# Patient Record
Sex: Female | Born: 1960 | Race: White | Hispanic: No | Marital: Married | Smoking: Never smoker
Health system: Southern US, Community
[De-identification: ages and names within clinical notes are randomized; demographics above are authoritative.]

## PROBLEM LIST (undated history)

## (undated) DIAGNOSIS — M810 Age-related osteoporosis without current pathological fracture: Secondary | ICD-10-CM

## (undated) DIAGNOSIS — R29898 Other symptoms and signs involving the musculoskeletal system: Secondary | ICD-10-CM

## (undated) DIAGNOSIS — A048 Other specified bacterial intestinal infections: Secondary | ICD-10-CM

## (undated) DIAGNOSIS — M6250 Muscle wasting and atrophy, not elsewhere classified, unspecified site: Secondary | ICD-10-CM

## (undated) DIAGNOSIS — K5190 Ulcerative colitis, unspecified, without complications: Secondary | ICD-10-CM

## (undated) HISTORY — DX: Other symptoms and signs involving the musculoskeletal system: R29.898

## (undated) HISTORY — DX: Muscle wasting and atrophy, not elsewhere classified, unspecified site: M62.50

## (undated) HISTORY — PX: BUNIONECTOMY: SHX129

## (undated) HISTORY — DX: Ulcerative colitis, unspecified, without complications: K51.90

## (undated) HISTORY — PX: UPPER GASTROINTESTINAL ENDOSCOPY: SHX188

## (undated) HISTORY — DX: Other specified bacterial intestinal infections: A04.8

## (undated) HISTORY — PX: POLYPECTOMY: SHX149

## (undated) HISTORY — DX: Age-related osteoporosis without current pathological fracture: M81.0

---

## 1997-10-05 ENCOUNTER — Ambulatory Visit (HOSPITAL_COMMUNITY): Admission: RE | Admit: 1997-10-05 | Discharge: 1997-10-05 | Payer: Self-pay | Admitting: Family Medicine

## 1998-12-23 ENCOUNTER — Other Ambulatory Visit: Admission: RE | Admit: 1998-12-23 | Discharge: 1998-12-23 | Payer: Self-pay | Admitting: Obstetrics & Gynecology

## 1999-11-28 ENCOUNTER — Encounter: Admission: RE | Admit: 1999-11-28 | Discharge: 1999-11-28 | Payer: Self-pay | Admitting: Sports Medicine

## 2000-03-19 ENCOUNTER — Encounter (INDEPENDENT_AMBULATORY_CARE_PROVIDER_SITE_OTHER): Payer: Self-pay

## 2000-03-19 ENCOUNTER — Other Ambulatory Visit: Admission: RE | Admit: 2000-03-19 | Discharge: 2000-03-19 | Payer: Self-pay | Admitting: Obstetrics & Gynecology

## 2002-06-27 ENCOUNTER — Other Ambulatory Visit: Admission: RE | Admit: 2002-06-27 | Discharge: 2002-06-27 | Payer: Self-pay | Admitting: Obstetrics & Gynecology

## 2003-09-26 ENCOUNTER — Other Ambulatory Visit: Admission: RE | Admit: 2003-09-26 | Discharge: 2003-09-26 | Payer: Self-pay | Admitting: Obstetrics & Gynecology

## 2005-04-20 ENCOUNTER — Other Ambulatory Visit: Admission: RE | Admit: 2005-04-20 | Discharge: 2005-04-20 | Payer: Self-pay | Admitting: Obstetrics & Gynecology

## 2007-06-16 HISTORY — PX: COLONOSCOPY: SHX174

## 2007-11-11 ENCOUNTER — Ambulatory Visit: Payer: Self-pay | Admitting: Internal Medicine

## 2007-12-22 ENCOUNTER — Ambulatory Visit: Payer: Self-pay | Admitting: Internal Medicine

## 2007-12-22 ENCOUNTER — Encounter: Payer: Self-pay | Admitting: Internal Medicine

## 2007-12-24 ENCOUNTER — Encounter: Payer: Self-pay | Admitting: Internal Medicine

## 2010-03-13 ENCOUNTER — Ambulatory Visit: Payer: Self-pay | Admitting: Sports Medicine

## 2010-03-13 DIAGNOSIS — M722 Plantar fascial fibromatosis: Secondary | ICD-10-CM | POA: Insufficient documentation

## 2010-03-13 DIAGNOSIS — M84376A Stress fracture, unspecified foot, initial encounter for fracture: Secondary | ICD-10-CM | POA: Insufficient documentation

## 2010-03-31 ENCOUNTER — Ambulatory Visit: Payer: Self-pay | Admitting: Sports Medicine

## 2010-03-31 DIAGNOSIS — M2170 Unequal limb length (acquired), unspecified site: Secondary | ICD-10-CM | POA: Insufficient documentation

## 2010-04-23 ENCOUNTER — Ambulatory Visit: Payer: Self-pay | Admitting: Sports Medicine

## 2010-04-23 DIAGNOSIS — M21619 Bunion of unspecified foot: Secondary | ICD-10-CM | POA: Insufficient documentation

## 2010-04-23 DIAGNOSIS — R269 Unspecified abnormalities of gait and mobility: Secondary | ICD-10-CM | POA: Insufficient documentation

## 2010-05-21 ENCOUNTER — Ambulatory Visit: Payer: Self-pay | Admitting: Sports Medicine

## 2010-07-17 NOTE — Assessment & Plan Note (Signed)
Summary: F/U,MC   Vital Signs:  Patient profile:   50 year old female BP sitting:   108 / 71  Vitals Entered By: Lillia Pauls CMA (March 31, 2010 11:10 AM)  History of Present Illness: Follow up navicular accesory stress fracture.  Has been wearing boot, using crutches, and using the exercise bike as instructed.  No pain with those activities.  Some pain when pressing on the navicular.  Swelling improved.  Well otherwise.   Current Problems (verified): 1)  Plantar Fasciitis, Bilateral  (ICD-728.71) 2)  Stress Fracture, Foot  (ICD-733.94)  Current Medications (verified): 1)  None  Allergies (verified): No Known Drug Allergies  Review of Systems  The patient denies anorexia, fever, weight loss, chest pain, syncope, and abdominal pain.    Physical Exam  General:  VS noted.  Well NAD Msk:  Legs: 1.5 cm leg legth discrepancy R>L Foot: Morton's feet bilaterally. Bunions bilaterally with L>>R. Bilateral collapse of longitudinal arch. No edema over medial aspect of navicular bone on R foot. Point tenderness over the same area.  No pain with ankle, foot, and toe ROM.   able to walk 10 steps now without limp or pain   Impression & Recommendations:  Problem # 1:  STRESS FRACTURE, FOOT (ICD-733.94) Assessment Improved Doing well.  Fx of accessory navicular bone. Plan to start weight bearing with walking boot.  Will follow up in 3.5 weeks  (6 weeks in boot). Pain as guide ice for pain. Pt voices understanding.   Problem # 2:  UNEQUAL LEG LENGTH (ICD-736.81) this needs correction to avoid futuire stress fxs  will make orthtoics on RTC to correct this  Patient Instructions: 1)  Thank you for seeing me today. 2)  Please come back in 3.5 weeks (6 weeks after being in the boot). 3)  Bring you running shoes and shorts with you. 4)  Please continue to wear your boot when walking.  5)  You may walk with the boot.    Orders Added: 1)  Est. Patient Level III [04540]

## 2010-07-17 NOTE — Assessment & Plan Note (Signed)
Summary: F/U,MC   Vital Signs:  Patient profile:   50 year old female BP sitting:   114 / 75  Vitals Entered By: Lillia Pauls CMA (May 21, 2010 9:38 AM)  History of Present Illness: pt here to fu left stress fx which she say feels good, but not 100%. pt went on a 3 mile run yesterday to test the foot and had no pain and even stated it felt better after the run. today she says it feels a little sore but not painful.  she had done 10 mins on TM OK had done xtraining and walking w no pain  however this run felt pretty long and she is sore in HS and calves now  Allergies: No Known Drug Allergies  Physical Exam  General:  Well-developed,well-nourished,in no acute distress; alert,appropriate and cooperative throughout examination Msk:  TTP over accesory navicular on left foot no swelling this time no percussion pain at main navicular  chronic changes in feet as before  today walks without any limp  running gait is sore and she is limping   Impression & Recommendations:  Problem # 1:  STRESS FRACTURE, FOOT (ICD-733.94) still not 100%  don't push running too quickly - see instructions  clinically though this appears to be healing  cont on this plan  if tenderness persistss past 1 month, I want to rescan  Problem # 2:  ABNORMALITY OF GAIT (ICD-781.2) orthotics controlling foot position well  use these for all running  no running with limp so use pain as guide  Patient Instructions: 1)  wait 2 to 3 days until you have less pain in foot 2)  then return to treadmill 3)  try 10 mins if OK you can add 2 mins per day of running until you reach 30 mins on Treadmill don't go to harder surfaces 4)  cross train as much as you wish 5)  keep up the 3 exercise and add steps pigeon toed - try 3 sets of 15 6)  don't increase any activity if you are seeing swelling or increasing pain levels   Orders Added: 1)  Est. Patient Level III [47829]

## 2010-07-17 NOTE — Assessment & Plan Note (Signed)
Summary: F/U STRESS FRACTURE,MC   Vital Signs:  Patient profile:   50 year old female BP sitting:   111 / 73  Vitals Entered By: Lillia Pauls CMA (April 23, 2010 9:21 AM)  CC:  stress fracture, plantar fasciitis, and gait abN.  History of Present Illness: Follow up navicular accesory stress fracture: Has been wearing boot and using the exercise bike and walking.  No pain with those activities but bored with biking.  Non-tender when tapping on navicular bone. Swelling improved.   Plantar fasciitis: Improved, continues to wear arch straps bilaterally but says she is not having any pain.   Leg length discrepency with gait abnormality: Pt has a leg length difference of 1.5 cm. She has trendelenburg walk and is in need of orthotics to correct her gait.   Bunion: Pt has bilatearl bunions with the Rt being much worse than the left. The Rt foot has a Morton's toe.   Allergies: No Known Drug Allergies  Physical Exam  General:  Well-developed,well-nourished,in no acute distress; alert,appropriate and cooperative throughout examination Msk:  bunions bilaterally Rt>>Lt, mortons toes on Right, left foot has accessory navicular with medial protrusion but nontender to palpation, leg length is different (Rt 1.5 cm longer than left) Pt has trendelenburg gait while walking.  hop test illicits pain on left and pt is only albe to do 1 hop,  5 hops on left without difficulty Additional Exam:  MSK [place] navicular area no longer has inc doppler flow small avulsion noted on accesory navicular left while acc nav is irregular - less sings of swelling or inc flow no edema PF on left is still 0.77 with some calcification at proximal medial insertion to calcaneus PF on RT is about 0.45     Impression & Recommendations:  Problem # 1:  STRESS FRACTURE, FOOT (ICD-733.94) Assessment Improved  Pt may wean from  boot to shoe w orthotics over next 2 wks  Patient was fitted for a standard,  cushioned, semi-rigid orthotic.  The orthotic was heated and the patient stood on the orthotic blank positioned on the orthotic stand. The patient was positioned in subtalar neutral position and 10 degrees of ankle dorsiflexion in a weight bearing stance. After completion of molding a stable based was applied to the orthotic blank.   The blank was ground to a stable position for weight bearing. size 7 blue swirl med density EVA base     reck after 4 weeks progresss bike - elliptical - to treadmill - no running more than 1 min interval until reck posting first ray post on RT additional orthotic padding  lift on left  [place] scan suggests healing  Orders: [employer], each unit (L3002)  Problem # 2:  PLANTAR FASCIITIS, BILATERAL (ICD-728.71) Assessment: Improved  Pt is not having any pain with the plantar fasciitis anymore. Cont to wear the straps for comfort as needed  still very thick on [place]  will start PF strech and exer series  Orders: [place] LIMITED ([id number]) [employer], each unit (Y7829)  Problem # 3:  ABNORMALITY OF GAIT (ICD-781.2) Assessment: Unchanged  Pt has gait abnormality on exam. This should be corrected with some custom orthotics.    after placemnt of orthotics patient has control of pronation there is trendelenburg to left w orthtoics but this is almost 100% corrected w them  Orders: [place] LIMITED ([id number]) [employer], each unit (Y8657)  Problem # 4:  BUNION, RIGHT FOOT (ICD-727.1) will follow   Orders Added: 1)  Est. Patient Level IV [14782] 2)  [place] LIMITED [[id number]] 3)  Or[employer], each unit [L3002]

## 2010-07-17 NOTE — Assessment & Plan Note (Signed)
Summary: PLANTAR FASCIITIS,MC   Vital Signs:  Patient profile:   50 year old female Height:      67 inches Weight:      147 pounds BMI:     23.11 BP sitting:   119 / 77  Vitals Entered By: Lillia Pauls CMA (March 13, 2010 11:19 AM)  History of Present Illness: Patient is a 50 yo F with a 3 month history of plantar fasciitis who comes in with continued discomfort and new swelling on medial aspect of foot. Patient tried using store-bought insoles and an arch strap without full relief. She has had to decrease her running. She works for [employer] and has to be on her feet most of the day, frequently wearing heals. About a month ago, she developed the swelling along the medial aspect of her foot. It was initially bruised. Swelling and bruising now improved, but she still has pain and is limited in her activity.  Allergies: No Known Drug Allergies  Physical Exam  General:  Well-developed,well-nourished,in no acute distress; alert,appropriate and cooperative throughout examination Msk:  Legs: 1.5 cm leg legth discrepancy R>L Foot: Morton's feet bilaterally. Bunions bilaterally with L>>R. Bilateral collapse of longitudinal arch. Edema over medial aspect of navicular bone on R foot. Point tenderness over the same area. Point tenderness on plantar surface of foot at insertion of plantar fascia to calcaneus.  Ultrasound: Accessory navicular bone on R with fluid and irregularity consistent with fracture. Increased doppler flow to accessory bone. Normal navicular bone without evidence of edema, no irregularity. Ant. tibialis tendon intact without edema, irregularity.  Plantar fascia increased thickness R>L, but evidence of thickening bilaterally.   Impression & Recommendations:  Problem # 1:  STRESS FRACTURE, FOOT (ICD-733.94)  Ultrasound and exam consistent with stress fracture of accessory navicular bone of R foot. Patient has leg length discrepancy, bunion on L, plantar fasciitis that  may have led to changes in gait predisposing her to injury. Patient fitted with cam boot. She will use the cam boot and crutches for 2 weeks. She will be allowed to bike without standing on the pedals. She will also try an arch strap. We will reassess in 2 weeks to evaluate modification of activity.  Patient also instructed on Calcium, Vitamin D and Vitamin C regimen. 1500 mg Ca daily 800 international units Vit D daily 500 mg Vit C daily  Orders: [facility] Walker [phone]) Foot Orthosis ( Arch Strap/Heel Cup) [phone]) Crutches-[facility] [phone])  reck in 2 to 3 weeks and if improved may allow some touch down weight bearing  Problem # 2:  PLANTAR FASCIITIS, BILATERAL (ICD-728.71) Will rehabilitate for stress fracture first. Then evaluate for custom orthotics to address leg length discrepancy and plantar fasciitis.  needs orthtoics over long term  bunions and morton's foot changes are part of general foot breakdown  Patient Instructions: 1)  1500 mg Ca daily 2)  800 international units Vitamin D daily 3)  500mg  Vitamin C daily 4)  Ice daily 5)  OK to do the bike. 6)  Crutches with no weight bearing for 2 weeks. Cast boot. Recheck in 2 weeks.

## 2013-10-18 ENCOUNTER — Other Ambulatory Visit: Payer: Self-pay | Admitting: Obstetrics & Gynecology

## 2013-10-18 DIAGNOSIS — R928 Other abnormal and inconclusive findings on diagnostic imaging of breast: Secondary | ICD-10-CM

## 2013-11-08 ENCOUNTER — Encounter (INDEPENDENT_AMBULATORY_CARE_PROVIDER_SITE_OTHER): Payer: Self-pay

## 2013-11-08 ENCOUNTER — Ambulatory Visit
Admission: RE | Admit: 2013-11-08 | Discharge: 2013-11-08 | Disposition: A | Discharge status: Home / Self Care | Payer: BC Managed Care – PPO | Source: Ambulatory Visit | Attending: Obstetrics & Gynecology | Admitting: Obstetrics & Gynecology

## 2013-11-08 DIAGNOSIS — R928 Other abnormal and inconclusive findings on diagnostic imaging of breast: Secondary | ICD-10-CM

## 2013-12-07 ENCOUNTER — Telehealth: Payer: Self-pay | Admitting: *Deleted

## 2013-12-08 ENCOUNTER — Encounter: Payer: Self-pay | Admitting: Podiatrist

## 2013-12-08 ENCOUNTER — Ambulatory Visit (INDEPENDENT_AMBULATORY_CARE_PROVIDER_SITE_OTHER): Payer: BC Managed Care – PPO | Admitting: Podiatrist

## 2013-12-08 ENCOUNTER — Ambulatory Visit: Payer: Self-pay | Admitting: Podiatrist

## 2013-12-08 ENCOUNTER — Ambulatory Visit: Payer: BC Managed Care – PPO

## 2013-12-08 VITALS — BP 109/67 | HR 68 | Resp 15 | Ht 67.5 in | Wt 145.0 lb

## 2013-12-08 DIAGNOSIS — M79673 Pain in unspecified foot: Secondary | ICD-10-CM

## 2013-12-08 DIAGNOSIS — M79609 Pain in unspecified limb: Secondary | ICD-10-CM

## 2013-12-08 NOTE — Patient Instructions (Signed)
Bunionectomy A bunionectomy is surgery to remove a bunion. A bunion is an enlargement of the joint at the base of the big toe. It is made up of bone and soft tissue on the inside part of the joint. Over time, a painful lump appears on the inside of the joint. The big toe begins to point inward toward the second toe. New bone growth can occur and a bone spur may form. The pain eventually causes difficulty walking. A bunion usually results from inflammation caused by the irritation of poorly fitting shoes. It often begins later in life. A bunionectomy is performed when nonsurgical treatment no longer works. When surgery is needed, the extent of the procedure will depend on the degree of deformity of the foot. Your surgeon will discuss with you the different procedures and what will work best for you depending on your age and health. LET YOUR CAREGIVER KNOW ABOUT:   Previous problems with anesthetics or medicines used to numb the skin.  Allergies to dyes, iodine, foods, and/or latex.  Medicines taken including herbs, eye drops, prescription medicines (especially medicines used to "thin the blood"), aspirin and other over-the-counter medicines, and steroids (by mouth or as a cream).  History of bleeding or blood problems.  Possibility of pregnancy, if this applies.  History of blood clots in your legs and/or lungs .  Previous surgery.  Other important health problems. RISKS AND COMPLICATIONS   Infection.  Pain.  Nerve damage.  Possibility that the bunion will recur. BEFORE THE PROCEDURE  You should be present 60 minutes prior to your procedure or as directed.  PROCEDURE  Surgery is often done so that you can go home the same day (outpatient). It may be done in a hospital or in an outpatient surgical center. An anesthetic will be used to help you sleep during the procedure. Sometimes, a spinal anesthetic is used to make you numb below the waist. A cut (incision) is made over the swollen  area at the first joint of the big toe. The enlarged lump will be removed. If there is a need to reposition the bones of the big toe, this may require more than 1 incision. The bone itself may need to be cut. Screws and wires may be used in the repair. These can be removed at a later date. In severe cases, the entire joint may need to be removed and a joint replacement inserted. When done, the incision is closed with stitches (sutures). Skin adhesive strips may be added for reinforcement. They help hold the incision closed.  AFTER THE PROCEDURE  Compression bandages (dressings) are then wrapped around the wound. This helps to keep the foot in alignment and reduce swelling. Your foot will be monitored for bleeding and swelling. You will need to stay for a few hours in the recovery area before being discharged. This allows time for the anesthesia to wear off. You will be discharged home when you are awake, stable, and doing well. HOME CARE INSTRUCTIONS   You can expect to return to normal activities within 6 to 8 weeks after surgery. The foot is at increased risk for swelling for several months. When you can expect to bear weight on the operated foot will depend on the extent of your surgery. The milder the deformity, the less tissue is removed and the sooner the return to normal activity level. During the recovery period, a special shoe, boot, or cast may be worn to accommodate the surgical bandage and to help provide stability   to the foot.  Once you are home, an ice pack applied to the operative site may help with discomfort and keep swelling down. Stop using the ice if it causes discomfort.  Keep your feet raised (elevated) when possible to lessen swelling.  If you have an elastic bandage on your foot and you have numbness, tingling, or your foot becomes cold and blue, adjust the bandage to make it comfortable.  Change dressings as directed.  Keep the wound dry and clean. The wound may be washed  gently with soap and water. Gently blot dry without rubbing. Do not take baths or use swimming pools or hot tubs for 10 days, or as instructed by your caregiver.  Only take over-the-counter or prescription medicines for pain, discomfort, or fever as directed by your caregiver.  You may continue a normal diet as directed.  For activity, use crutches with no weight bearing or your orthopedic shoe as directed. Continue to use crutches or a cane as directed until you can stand without causing pain. SEEK MEDICAL CARE IF:   You have redness, swelling, bruising, or increasing pain in the wound.  There is pus coming from the wound.  You have drainage from a wound lasting longer than 1 day.  You have an oral temperature above 102 F (38.9 C).  You notice a bad smell coming from the wound or dressing.  The wound breaks open after sutures have been removed.  You develop dizzy episodes or fainting while standing.  You have persistent nausea or vomiting.  Your toes become cold.  Pain is not relieved with medicines. SEEK IMMEDIATE MEDICAL CARE IF:   You develop a rash.  You have difficulty breathing.  You develop any reaction or side effects to medicines given.  Your toes are numb or blue, or you have severe pain. MAKE SURE YOU:   Understand these instructions.  Will watch your condition.  Will get help right away if you are not doing well or get worse. Document Released: 05/15/2005 Document Revised: 08/24/2011 Document Reviewed: 06/20/2007 Exit[employer] Patient Information 2015 [employer]. This information is not intended to replace advice given to you by your health care provider. Make sure you discuss any questions you have with your health care provider.  

## 2013-12-08 NOTE — Progress Notes (Signed)
   Subjective:    Patient ID: Rachael Walker, female    DOB: [birth year], 53 y.o.   MRN: [id number]  HPI Comments: Pt states she has pain on and off with the Right foot due to the bunion and 2nd hammer toe, for over 20 years. Pt states she really has trouble running on hills.  Pt states the right 1st medial toe has a painful callous.  Pt states the thickened, discolored toenails are painful when running - right 1,2, 4,5th and left 1,4,5 th toenails.     Review of Systems  All other systems reviewed and are negative.      Objective:   Physical Exam Patient is awake, alert, and oriented x 3.  In no acute distress.  Vascular status is intact with palpable pedal pulses at 2/4 DP and PT bilateral and capillary refill time within normal limits. Neurological sensation is also intact bilaterally via Semmes Weinstein monofilament at 5/5 sites. Light touch, vibratory sensation, Achilles tendon reflex is intact. Dermatological exam reveals dystropic toenails from running otherwise skin color, turger and texture as normal. No open lesions present.  Musculature intact with dorsiflexion, plantarflexion, inversion, eversion. Severe bunion deformity is present on the right foot.  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  Large medial eminence from the bunion is present .  Large callus is present on the hallux and first metatarsal head right foot.      Assessment & Plan:  Bunion and hammertoe right foot  Plan:  The patient has tried changing shoes and reducing her running to walking and is still experiencing pain.  We discussed orthotics but ultimately discussed that surgical intervention would be necessary to correct the deformity.  She would like to consider the surgery in the cooler months as she is very active at this time.  She will call 1-2 months prior to the anticipated surgery date for a pre operative consult.  I did discuss that she will need to be in a large boot for 2-4  weeks followed by a post op shoe. No running for at least 3 months post operatively.  She demonstrates an understanding of this conversation and will be seen back at her convenience for pre operative consult.

## 2014-01-11 ENCOUNTER — Ambulatory Visit (INDEPENDENT_AMBULATORY_CARE_PROVIDER_SITE_OTHER): Payer: BC Managed Care – PPO

## 2014-01-11 ENCOUNTER — Other Ambulatory Visit: Payer: Self-pay

## 2014-01-11 DIAGNOSIS — R52 Pain, unspecified: Secondary | ICD-10-CM

## 2014-02-01 NOTE — Telephone Encounter (Signed)
Prescription sent to [facility] for a Musculoskeletal Pain compounding cream. Baclofen 2%, Cyclobenzaprine 2%, Diclofenac 3% and Lidocaine 2% 180 gm tube, 2 refills, Diagnosis: [facility] [address] [place], [place] [address] Ph: [phone]

## 2014-03-19 ENCOUNTER — Encounter: Payer: Self-pay | Admitting: Family Medicine

## 2014-03-19 ENCOUNTER — Ambulatory Visit (INDEPENDENT_AMBULATORY_CARE_PROVIDER_SITE_OTHER): Payer: BC Managed Care – PPO | Admitting: Family Medicine

## 2014-03-19 VITALS — BP 104/68 | HR 63 | Temp 98.3°F | Resp 16 | Ht 68.0 in | Wt 146.6 lb

## 2014-03-19 DIAGNOSIS — R312 Other microscopic hematuria: Secondary | ICD-10-CM

## 2014-03-19 DIAGNOSIS — Z23 Encounter for immunization: Secondary | ICD-10-CM

## 2014-03-19 DIAGNOSIS — B3731 Acute candidiasis of vulva and vagina: Secondary | ICD-10-CM

## 2014-03-19 DIAGNOSIS — B373 Candidiasis of vulva and vagina: Secondary | ICD-10-CM

## 2014-03-19 DIAGNOSIS — R300 Dysuria: Secondary | ICD-10-CM

## 2014-03-19 DIAGNOSIS — R3129 Other microscopic hematuria: Secondary | ICD-10-CM

## 2014-03-19 LAB — POCT WET PREP WITH KOH
KOH Prep POC: POSITIVE
Trichomonas, UA: NEGATIVE
Yeast Wet Prep HPF POC: NEGATIVE

## 2014-03-19 LAB — POCT UA - MICROSCOPIC ONLY
Casts, Ur, LPF, POC: NEGATIVE
Crystals, Ur, HPF, POC: NEGATIVE
Mucus, UA: NEGATIVE
Yeast, UA: NEGATIVE

## 2014-03-19 LAB — POCT URINALYSIS DIPSTICK
BILIRUBIN UA: NEGATIVE
GLUCOSE UA: NEGATIVE
KETONES UA: NEGATIVE
NITRITE UA: NEGATIVE
PH UA: 5.5
Protein, UA: NEGATIVE
Spec Grav, UA: 1.015
Urobilinogen, UA: 0.2

## 2014-03-19 MED ORDER — FLUCONAZOLE 150 MG PO TABS: 150.0000 mg | ORAL_TABLET | Freq: Once | ORAL | Status: DC

## 2014-03-19 NOTE — Patient Instructions (Signed)
I will be in touch with your urine culture asap. Let me know if your back pain returns.   For the time being use the diflucan for likely vaginal yeast infection- take 1 pill and repeat in a week if needed

## 2014-03-19 NOTE — Progress Notes (Addendum)
[facility] [address] [place] [address] [phone]  Date:  03/19/2014   Name:  Rachael Walker   DOB:  [birth year]   MRN:  [id number]  PCP:  Maisie Fus, MD    Chief Complaint: pt here to establish care and Back Pain   History of Present Illness:  Rachael Walker is a 53 y.o. very pleasant female patient who presents with the following:  Here today as a new patient to establish care.  She sees Dr. Nori Riis for her OB-GYN care, but wanted to get established with a family doctor as well as she is getting a bit older.  She has noted some aches in her upper back for about 6 week, more at night.  It felt "kind of like a cramp", could wake her up at night.  Sometimes the sx have been worse than others.  When she gets up in the am she will feel better.  She also thought that she might have a UTI and called her OBG.  Her urine was not tested but they called in macrobid for her about 3 weeks ago.  However it turns out she took it qd for 14 days instead of BID for 7 days so she is not sure if this was effective.  She still notes that she still has frequent urination- no burning, no hematuria.  She can have lower back ache as well.  She has also noted some vaginal burning for about 5 weeks.  No unusual discharge.  She also has noted some itchy palms and feet- this is now better.  Also she has noted blurred vision for about 5 weeks.  She does wear glasses but is due for a recheck   Last week she went out of town and was having worse back pain, she thinks due to strange bed.  However now her pain has really subsided  She enjoys running for exercise.   Her last pap was over the summer with Dr. Nori Riis.   She also recently had complete labs through her job and reports that her blood sugar was normal.   Patient Active Problem List   Diagnosis Date Noted  . BUNION, RIGHT FOOT 04/23/2010  . ABNORMALITY OF GAIT 04/23/2010  . UNEQUAL LEG LENGTH 03/31/2010  . PLANTAR FASCIITIS,  BILATERAL 03/13/2010  . STRESS FRACTURE, FOOT 03/13/2010    History reviewed. No pertinent past medical history.  History reviewed. No pertinent past surgical history.  History  Substance Use Topics  . Smoking status: Never Smoker   . Smokeless tobacco: Not on file  . Alcohol Use: Yes     Comment: 2    Family History  Problem Relation Age of Onset  . Osteoporosis Mother   . Cancer Father   . Diabetes Father   . Hyperlipidemia Father   . Cancer Maternal Grandmother     kidney  . Heart disease Maternal Grandfather   . Diabetes Paternal Grandmother   . Diabetes Brother     No Known Allergies  Medication list has been reviewed and updated.  No current outpatient prescriptions on file prior to visit.   No current facility-administered medications on file prior to visit.    Review of Systems:  As per HPI- otherwise negative.   Physical Examination: Filed Vitals:   03/19/14 1100  BP: 104/68  Pulse: 63  Temp: 98.3 F (36.8 C)  Resp: 16   Filed Vitals:   03/19/14 1100  Height: 5\' 8"  (1.727  m)  Weight: 146 lb 9.6 oz (66.497 kg)   Body mass index is 22.3 kg/(m^2). Ideal Body Weight: Weight in (lb) to have BMI = 25: 164.1  GEN: WDWN, NAD, Non-toxic, A & O x 3, looks well, appears fit and younger than age 58: Atraumatic, Normocephalic. Neck supple. No masses, No LAD. Ears and Nose: No external deformity. CV: RRR, No M/G/R. No JVD. No thrill. No extra heart sounds. PULM: CTA B, no wheezes, crackles, rhonchi. No retractions. No resp. distress. No accessory muscle use. ABD: S, NT, ND EXTR: No c/c/e NEURO Normal gait.  PSYCH: Normally interactive. Conversant. Not depressed or anxious appearing.  Calm demeanor.  Pelvic: normal, no vaginal lesions or discharge. Uterus normal, no CMT, no adnexal tendereness or masses At this time cannot reproduce her back pain by pressing on her back or manipulating her arms  Results for orders placed in visit on 03/19/14  POCT  UA - MICROSCOPIC ONLY      Result Value Ref Range   WBC, Ur, HPF, POC 10-13     RBC, urine, microscopic 4-6     Bacteria, U Microscopic trace     Mucus, UA neg     Epithelial cells, urine per micros tntc     Crystals, Ur, HPF, POC neg     Casts, Ur, LPF, POC neg     Yeast, UA neg    POCT URINALYSIS DIPSTICK      Result Value Ref Range   Color, UA yellow     Clarity, UA turbid     Glucose, UA neg     Bilirubin, UA neg     Ketones, UA neg     Spec Grav, UA 1.015     Blood, UA trace     pH, UA 5.5     Protein, UA neg     Urobilinogen, UA 0.2     Nitrite, UA neg     Leukocytes, UA small (1+)    POCT WET PREP WITH KOH      Result Value Ref Range   Trichomonas, UA Negative     Clue Cells Wet Prep HPF POC 6-19     Epithelial Wet Prep HPF POC 6-19     Yeast Wet Prep HPF POC neg     Bacteria Wet Prep HPF POC 3+     RBC Wet Prep HPF POC 2-5     WBC Wet Prep HPF POC 2-4     KOH Prep POC Positive       Assessment and Plan: Dysuria - Plan: POCT UA - Microscopic Only, POCT urinalysis dipstick, POCT Wet Prep with KOH, Urine culture  Monilial vaginitis - Plan: fluconazole (DIFLUCAN) 150 MG tablet  Recommended that we do x-rays of her back but she declines for now as her pain really seems better.  Unsure if she might have had a UTI recently as her urine was not tested.  She did take abx but did not take them correctly.  Her UA and micro are borderline.   I will give her a call with her urine culture- for the time being will just use diflucan for yeast vaginitis.  Defer another abx until culture comes back   Signed Lamar Blinks, MD Called 10/7: her culture is negative.  I will refer her to a urologist as she did have microhematuria.

## 2014-03-20 LAB — URINE CULTURE: Colony Count: 15000

## 2014-03-21 ENCOUNTER — Encounter: Payer: Self-pay | Admitting: Family Medicine

## 2014-03-21 NOTE — Addendum Note (Signed)
Addended by: Lamar Blinks C on: 03/21/2014 05:17 PM   Modules accepted: Orders

## 2014-06-15 ENCOUNTER — Ambulatory Visit (INDEPENDENT_AMBULATORY_CARE_PROVIDER_SITE_OTHER): Payer: BLUE CROSS/BLUE SHIELD | Admitting: Physician Assistant

## 2014-06-15 VITALS — BP 130/80 | HR 73 | Temp 97.8°F | Resp 16 | Ht 67.5 in | Wt 145.0 lb

## 2014-06-15 DIAGNOSIS — L0291 Cutaneous abscess, unspecified: Secondary | ICD-10-CM

## 2014-06-15 DIAGNOSIS — L03312 Cellulitis of back [any part except buttock]: Secondary | ICD-10-CM

## 2014-06-15 DIAGNOSIS — Z23 Encounter for immunization: Secondary | ICD-10-CM

## 2014-06-15 DIAGNOSIS — L0390 Cellulitis, unspecified: Secondary | ICD-10-CM

## 2014-06-15 MED ORDER — DOXYCYCLINE HYCLATE 100 MG PO CAPS: 100.0000 mg | ORAL_CAPSULE | Freq: Two times a day (BID) | ORAL | Status: AC

## 2014-06-15 NOTE — Progress Notes (Signed)
    MRN: [id number] DOB: 1960-07-10  Subjective:   Rachael Walker is a 54 y.o. female with a recent history of foot surgery (4 weeks ago), but otherwise healthy, is  presenting for concern of an unhealed wound at the center of her lower back following a fall 3 weeks ago.  She was in her bathroom, where she mistepped, and fell backwards, hitting her back on an air vent.   She notes that a nail was sticking out, but she was unsure if she had fallen onto that.  Since the fall, the wound has appeared to be healing, but has regressed.  She notes pain and swelling to her lower back.  She states that she was able to appreciate pus from the site.  She has tried neosporin, which has helped little.  She sleeps on her back and takes baths, and notes that the wound rubs against the grounding surface.  She denies fever, n/v, or numbness and tingling to lower extremity.  She is unsure of when her last tdap was updated.    Rachael Walker has a current medication list which includes the following prescription(s): drospirenone-ethinyl estradiol.  She has No Known Allergies.  Rachael Walker  has no past medical history on file. Also  has no past surgical history on file.  ROS As in subjective.  Objective:   Vitals: BP 130/80 mmHg  Pulse 73  Temp(Src) 97.8 F (36.6 C) (Oral)  Resp 16  Ht 5' 7.5" (1.715 m)  Wt 145 lb (65.772 kg)  BMI 22.36 kg/m2  SpO2 97%  Physical Exam  Constitutional: She is oriented to person, place, and time and well-developed, well-nourished, and in no distress. No distress.  Cardiovascular: Normal rate.   Pulmonary/Chest: Effort normal and breath sounds normal. No respiratory distress.  Neurological: She is alert and oriented to person, place, and time.  Skin:  Erythematous, indurated lesion with exudate.  No fluctuance appreciated.  Just lateral at right side, is small scabbing site with mild erythema.  No induration or fluctuance appreciated.    Psychiatric: Mood, memory, affect and  judgment normal.   Procedure: Verbal consent obtained.  2% Lidocaine used at site.  Povidine used for cleaning.  Incision placed at wound site with 11 blade.  Necrotic skin coat superficial dermis.  No purulent material produced.  Incision packed.  Dressings applied.     Assessment and Plan :  54 year old female is here for a wound at sacrum that has not healed in 3 weeks.  My suspicion is pressure has been applied to this area following injury and leg ambulation, that the wound has consistently had pressure and deroofing.  This must heal by secondary intention.  Placed on doxycycline 100mg  BID for 10 days Patient will RTC in 48 hours.  Advised to keep pressure off of the wound site.  Cellulitis of back except buttock - Plan: Wound culture, doxycycline (VIBRAMYCIN) 100 MG capsule Cellulitis and abscess - Plan: Wound culture  Need for Tdap vaccination - Plan: Tdap vaccine greater than or equal to 7yo IM  Rachael Drape, PA-C [facility] 1/1/20168:15 PM

## 2014-06-15 NOTE — Patient Instructions (Signed)
Incision and Drainage Incision and drainage is a procedure in which a sac-like structure (cystic structure) is opened and drained. The area to be drained usually contains material such as pus, fluid, or blood.  LET YOUR CAREGIVER KNOW ABOUT:   Allergies to medicine.  Medicines taken, including vitamins, herbs, eyedrops, over-the-counter medicines, and creams.  Use of steroids (by mouth or creams).  Previous problems with anesthetics or numbing medicines.  History of bleeding problems or blood clots.  Previous surgery.  Other health problems, including diabetes and kidney problems.  Possibility of pregnancy, if this applies. RISKS AND COMPLICATIONS  Pain.  Bleeding.  Scarring.  Infection. BEFORE THE PROCEDURE  You may need to have an ultrasound or other imaging tests to see how large or deep your cystic structure is. Blood tests may also be used to determine if you have an infection or how severe the infection is. You may need to have a tetanus shot. PROCEDURE  The affected area is cleaned with a cleaning fluid. The cyst area will then be numbed with a medicine (local anesthetic). A small incision will be made in the cystic structure. A syringe or catheter may be used to drain the contents of the cystic structure, or the contents may be squeezed out. The area will then be flushed with a cleansing solution. After cleansing the area, it is often gently packed with a gauze or another wound dressing. Once it is packed, it will be covered with gauze and tape or some other type of wound dressing. AFTER THE PROCEDURE   Often, you will be allowed to go home right after the procedure.  You may be given antibiotic medicine to prevent or heal an infection.  If the area was packed with gauze or some other wound dressing, you will likely need to come back in 1 to 2 days to get it removed.  The area should heal in about 14 days. Document Released: 11/25/2000 Document Revised: 12/01/2011  Document Reviewed: 07/27/2011 ExitCare Patient Information 2015 [employer]. This information is not intended to replace advice given to you by your health care provider. Make sure you discuss any questions you have with your health care provider.  

## 2014-06-17 ENCOUNTER — Ambulatory Visit (INDEPENDENT_AMBULATORY_CARE_PROVIDER_SITE_OTHER): Payer: BC Managed Care – PPO | Admitting: Physician Assistant

## 2014-06-17 VITALS — BP 118/78 | HR 82 | Temp 97.8°F | Resp 16

## 2014-06-17 DIAGNOSIS — L0390 Cellulitis, unspecified: Secondary | ICD-10-CM

## 2014-06-17 DIAGNOSIS — L0291 Cutaneous abscess, unspecified: Secondary | ICD-10-CM

## 2014-06-17 LAB — WOUND CULTURE
GRAM STAIN: NONE SEEN
Gram Stain: NONE SEEN

## 2014-06-17 NOTE — Progress Notes (Signed)
   Subjective:    Patient ID: Rachael Walker, female    DOB: 1960-09-15, 54 y.o.   MRN: [id number]  HPI Patient presents for wound check following I & D 2 days ago. Has been well in the interim. Denies additional production of pus, fever, or pain. Antibiotic well tolerated when taken with food. Did have one day of nausea when taken without breakfast. Has changed dressing twice. NKDA.   Review of Systems  Constitutional: Negative for fever and chills.  Skin: Positive for wound. Negative for color change and rash.       Objective:   Physical Exam  Constitutional: She is oriented to person, place, and time. She appears well-developed and well-nourished. No distress.  Blood pressure 118/78, pulse 82, temperature 97.8 F (36.6 C), resp. rate 16, last menstrual period 05/15/2014, SpO2 99 %.  HENT:  Head: Normocephalic and atraumatic.  Right Ear: External ear normal.  Left Ear: External ear normal.  Eyes: Right eye exhibits no discharge. Left eye exhibits no discharge.  Neurological: She is alert and oriented to person, place, and time.  Skin: Skin is warm and dry. No rash noted. She is not diaphoretic. No pallor.  Wound encircled minimally by erythema. Surface wound approx. same as depth of wound. Entire wound has necrotic tissue. Minimal pus expressed.    Procedure Consent obtained. Packing removed. Wound explored. Irrigated with 3cc 1% lido. Necrotic tissue removed. 1/2 in plain packing placed. Dressing placed. Care instructions given.      Assessment & Plan:  1. Cellulitis and abscess Wound healing ok. Antibiotic choice appropriate and should continue. RTC 06/20/14 for wound check.   Alveta Heimlich PA-C  [facility] 06/17/2014 9:12 AM

## 2014-06-20 ENCOUNTER — Ambulatory Visit (INDEPENDENT_AMBULATORY_CARE_PROVIDER_SITE_OTHER): Payer: BLUE CROSS/BLUE SHIELD | Admitting: Family Medicine

## 2014-06-20 VITALS — BP 106/68 | HR 78 | Temp 97.8°F | Resp 16

## 2014-06-20 DIAGNOSIS — Z5189 Encounter for other specified aftercare: Secondary | ICD-10-CM

## 2014-06-20 NOTE — Progress Notes (Signed)
[facility] [address], [place] [address] [phone]  Date:  06/20/2014   Name:  Rachael Walker   DOB:  03-25-1961   MRN:  [id number]  PCP:  Maisie Fus, MD    Chief Complaint: Wound Check   History of Present Illness:  Rachael Walker is a 54 y.o. very pleasant female patient who presents with the following:  Here today for a wound check- s/p I and D on 06/15/2014.   Last dressing change was on 1/3 She is doing quite well overall No pain or drainage No fever  Patient Active Problem List   Diagnosis Date Noted  . BUNION, RIGHT FOOT 04/23/2010  . ABNORMALITY OF GAIT 04/23/2010  . UNEQUAL LEG LENGTH 03/31/2010  . PLANTAR FASCIITIS, BILATERAL 03/13/2010  . STRESS FRACTURE, FOOT 03/13/2010    No past medical history on file.  Past Surgical History  Procedure Laterality Date  . Bunionectomy Right     History  Substance Use Topics  . Smoking status: Never Smoker   . Smokeless tobacco: Never Used  . Alcohol Use: Yes     Comment: 2    Family History  Problem Relation Age of Onset  . Osteoporosis Mother   . Cancer Father   . Diabetes Father   . Hyperlipidemia Father   . Cancer Maternal Grandmother     kidney  . Heart disease Maternal Grandfather   . Diabetes Paternal Grandmother   . Diabetes Brother     No Known Allergies  Medication list has been reviewed and updated.  Current Outpatient Prescriptions on File Prior to Visit  Medication Sig Dispense Refill  . doxycycline (VIBRAMYCIN) 100 MG capsule Take 1 capsule (100 mg total) by mouth 2 (two) times daily. 20 capsule 0  . Drospirenone-Ethinyl Estradiol (LORYNA PO) Take by mouth.     No current facility-administered medications on file prior to visit.    Review of Systems:  As per HPI- otherwise negative.   Physical Examination: Filed Vitals:   06/20/14 0827  BP: 106/68  Pulse: 78  Temp: 97.8 F (36.6 C)  Resp: 16   There were no vitals filed for this  visit. There is no weight on file to calculate BMI. Ideal Body Weight:     GEN: WDWN, NAD, Non-toxic, Alert & Oriented x 3 HEENT: Atraumatic, Normocephalic.  Ears and Nose: No external deformity. EXTR: No clubbing/cyanosis/edema NEURO: in a cast on the right foot for recent foot surgery PSYCH: Normally interactive. Conversant. Not depressed or anxious appearing.  Calm demeanor.   Small abscess on her mid lower back superior to gluteal cleft Removed dressing and small amount of packing.   No purulence or tenderness.   Still slightly indurated Did not pack but dressed again with non- stick and hypafix  Assessment and Plan: Wound check, abscess  As above- did no repack and she can now shower and cover wound at home/  Follow-up if any problems arise  Signed Lamar Blinks, MD

## 2014-06-21 ENCOUNTER — Telehealth: Payer: Self-pay | Admitting: Physician Assistant

## 2014-06-21 NOTE — Telephone Encounter (Signed)
Attempted to call patient.  LMOM that labs aligned with the treatment plan.  Advised pt. To call back for more detail:    If patient calls back, MRSA which the antibiotic that she is taking well covers.  She should return if she has concern or no improvements.

## 2014-10-16 ENCOUNTER — Encounter: Payer: Self-pay | Admitting: Internal Medicine

## 2015-03-01 ENCOUNTER — Ambulatory Visit (INDEPENDENT_AMBULATORY_CARE_PROVIDER_SITE_OTHER): Payer: BLUE CROSS/BLUE SHIELD | Admitting: Family Medicine

## 2015-03-01 VITALS — BP 108/70 | HR 59 | Temp 98.3°F | Resp 16 | Ht 67.75 in | Wt 148.6 lb

## 2015-03-01 DIAGNOSIS — R309 Painful micturition, unspecified: Secondary | ICD-10-CM

## 2015-03-01 DIAGNOSIS — N3000 Acute cystitis without hematuria: Secondary | ICD-10-CM | POA: Diagnosis not present

## 2015-03-01 LAB — POCT UA - MICROSCOPIC ONLY
CASTS, UR, LPF, POC: NEGATIVE
Crystals, Ur, HPF, POC: NEGATIVE
MUCUS UA: NEGATIVE
Yeast, UA: NEGATIVE

## 2015-03-01 LAB — POCT URINALYSIS DIPSTICK
BILIRUBIN UA: NEGATIVE
Glucose, UA: NEGATIVE
Nitrite, UA: NEGATIVE
Protein, UA: >=300
Spec Grav, UA: >=1.03
Urobilinogen, UA: 0.2
pH, UA: 5.5

## 2015-03-01 MED ORDER — CIPROFLOXACIN HCL 250 MG PO TABS: 250.0000 mg | ORAL_TABLET | Freq: Two times a day (BID) | ORAL | Status: DC

## 2015-03-01 NOTE — Progress Notes (Addendum)
[facility] [address] [place] [address] [phone]  Date:  03/01/2015   Name:  Rachael Walker   DOB:  01-Dec-1960   MRN:  [id number]  PCP:  Maisie Fus, MD    Chief Complaint: hurts to urinate; Urinary Frequency; and Back Pain   History of Present Illness:  Rachael Walker is a 54 y.o. very pleasant female patient who presents with the following:  Here today with a likely UTI- she has noted sx for about a week to10 days; she had been traveling and could not deal with it before today.   Sx have waxed and waned She has noted sx of dysuria.   No blood in her urine, but is is cloudy She is fit, gets a lot of exercise and does not tend to drink a lot of water  She had noted some lower back pain, but this is now better She is not sure, but think she could have fun a fever one evening a few days ago No vomiting  LMP was 5 days ago She notes lower belly pain discomfort over the bladder  Patient Active Problem List   Diagnosis Date Noted  . BUNION, RIGHT FOOT 04/23/2010  . ABNORMALITY OF GAIT 04/23/2010  . UNEQUAL LEG LENGTH 03/31/2010  . PLANTAR FASCIITIS, BILATERAL 03/13/2010  . STRESS FRACTURE, FOOT 03/13/2010    No past medical history on file.  Past Surgical History  Procedure Laterality Date  . Bunionectomy Right     Social History  Substance Use Topics  . Smoking status: Never Smoker   . Smokeless tobacco: Never Used  . Alcohol Use: Yes     Comment: 2    Family History  Problem Relation Age of Onset  . Osteoporosis Mother   . Cancer Father   . Diabetes Father   . Hyperlipidemia Father   . Cancer Maternal Grandmother     kidney  . Heart disease Maternal Grandfather   . Diabetes Paternal Grandmother   . Diabetes Brother     No Known Allergies  Medication list has been reviewed and updated.  Current Outpatient Prescriptions on File Prior to Visit  Medication Sig Dispense Refill  . Drospirenone-Ethinyl Estradiol  (LORYNA PO) Take by mouth.     No current facility-administered medications on file prior to visit.    Review of Systems:  As per HPI- otherwise negative.   Physical Examination: Filed Vitals:   03/01/15 0912  BP: 108/70  Pulse: 59  Temp: 98.3 F (36.8 C)  Resp: 16   Filed Vitals:   03/01/15 0912  Height: 5' 7.75" (1.721 m)  Weight: 148 lb 9.6 oz (67.405 kg)   Body mass index is 22.76 kg/(m^2). Ideal Body Weight: Weight in (lb) to have BMI = 25: 162.9  GEN: WDWN, NAD, Non-toxic, A & O x 3, looks well, fit build HEENT: Atraumatic, Normocephalic. Neck supple. No masses, No LAD. Ears and Nose: No external deformity. CV: RRR, No M/G/R. No JVD. No thrill. No extra heart sounds. PULM: CTA B, no wheezes, crackles, rhonchi. No retractions. No resp. distress. No accessory muscle use. ABD: S,  ND, +BS. No rebound. No HSM.  Minimal suprapubic tenderness EXTR: No c/c/e NEURO Normal gait.  PSYCH: Normally interactive. Conversant. Not depressed or anxious appearing.  Calm demeanor.    Results for orders placed or performed in visit on 03/01/15  POCT UA - Microscopic Only  Result Value Ref Range   WBC, Ur, HPF, POC TNTC  RBC, urine, microscopic 0-2    Bacteria, U Microscopic trace    Mucus, UA neg    Epithelial cells, urine per micros 0-3    Crystals, Ur, HPF, POC neg    Casts, Ur, LPF, POC neg    Yeast, UA neg   POCT urinalysis dipstick  Result Value Ref Range   Color, UA yellow    Clarity, UA cloudy    Glucose, UA neg    Bilirubin, UA neg    Ketones, UA trace    Spec Grav, UA >=1.030    Blood, UA mod    pH, UA 5.5    Protein, UA >=300    Urobilinogen, UA 0.2    Nitrite, UA neg    Leukocytes, UA large (3+) (A) Negative    Assessment and Plan: Acute cystitis without hematuria - Plan: ciprofloxacin (CIPRO) 250 MG tablet  Painful urination - Plan: POCT UA - Microscopic Only, POCT urinalysis dipstick, Urine culture  Treat for UTI with cipro as above Await  culture, she will let me know if not improved   Signed Lamar Blinks, MD  Received culture 9/19  Enterococcus, sen to levaquin but did not report cipro. Called her- she reports that she feels "100%" better.

## 2015-03-01 NOTE — Patient Instructions (Addendum)
We are going to treat you for a UTI with cipro for 3 days. Drink plenty of water! Azo (pyridium) can help with discomfort  Let mer know if you are not improved in the next 1-2 days I will be in touch if your urine culture shows any problem

## 2015-03-03 LAB — URINE CULTURE: Colony Count: 75000

## 2015-03-04 ENCOUNTER — Encounter: Payer: Self-pay | Admitting: Family Medicine

## 2015-03-04 NOTE — Addendum Note (Signed)
Addended by: Lamar Blinks C on: 03/04/2015 08:35 AM   Modules accepted: Orders

## 2015-03-19 ENCOUNTER — Other Ambulatory Visit: Payer: Self-pay | Admitting: Obstetrics & Gynecology

## 2015-03-20 LAB — CYTOLOGY - PAP

## 2015-08-21 ENCOUNTER — Encounter: Payer: Self-pay | Admitting: Podiatry

## 2015-08-21 ENCOUNTER — Ambulatory Visit (INDEPENDENT_AMBULATORY_CARE_PROVIDER_SITE_OTHER): Payer: BLUE CROSS/BLUE SHIELD | Admitting: Podiatry

## 2015-08-21 VITALS — BP 99/59 | HR 56 | Resp 16

## 2015-08-21 DIAGNOSIS — L600 Ingrowing nail: Secondary | ICD-10-CM | POA: Diagnosis not present

## 2015-08-21 DIAGNOSIS — M21619 Bunion of unspecified foot: Secondary | ICD-10-CM | POA: Diagnosis not present

## 2015-08-21 NOTE — Patient Instructions (Signed)

## 2015-08-21 NOTE — Progress Notes (Signed)
Subjective:     Patient ID: Rachael Walker, female   DOB: [birth year], 55 y.o.   MRN: [id number]  HPI patient presents with painful ingrown toenail the left big toe lateral border and also history of bunion correction right that she wanted to have checked along with digital correction   Review of Systems     Objective:   Physical Exam Neurovascular status intact no other change in health history except for bunion repair done last year with incurvated lateral border left hallux that's painful when pressed    Assessment:     Structural bunion right with mild elevation of the toe loss range of motion with ingrown toenail deformity left hallux lateral border    Plan:     H&P and both conditions discussed. I've recommend correction of the ingrown and she wants it done and I explained procedure to her and risk and I infiltrated the left hallux 60 mg like Marcaine mixture remove the lateral border exposed matrix and applied phenol 3 applications 30 seconds followed by alcohol lavaged sterile dressing. We discussed the right foot and I don't see any further procedures that would be of benefit and she is able to run which I would consider a very good success for this particular problem

## 2015-09-06 ENCOUNTER — Telehealth: Payer: Self-pay | Admitting: *Deleted

## 2015-09-06 NOTE — Telephone Encounter (Signed)
Called patient at [phone] (Home #) to check to see how they were doing from their ingrown toenail procedure that was performed on Wednesday, August 21, 2015. Waiting for a response.

## 2016-04-01 ENCOUNTER — Ambulatory Visit
Admission: RE | Admit: 2016-04-01 | Discharge: 2016-04-01 | Disposition: A | Discharge status: Home / Self Care | Payer: BLUE CROSS/BLUE SHIELD | Source: Ambulatory Visit | Attending: Sports Medicine | Admitting: Sports Medicine

## 2016-04-01 ENCOUNTER — Encounter: Payer: Self-pay | Admitting: Sports Medicine

## 2016-04-01 ENCOUNTER — Ambulatory Visit (INDEPENDENT_AMBULATORY_CARE_PROVIDER_SITE_OTHER): Payer: BLUE CROSS/BLUE SHIELD | Admitting: Sports Medicine

## 2016-04-01 VITALS — BP 127/76 | HR 54 | Ht 67.5 in | Wt 145.0 lb

## 2016-04-01 DIAGNOSIS — M25561 Pain in right knee: Secondary | ICD-10-CM

## 2016-04-01 DIAGNOSIS — M25562 Pain in left knee: Secondary | ICD-10-CM

## 2016-04-01 DIAGNOSIS — M2240 Chondromalacia patellae, unspecified knee: Secondary | ICD-10-CM | POA: Diagnosis not present

## 2016-04-01 NOTE — Progress Notes (Signed)
   Subjective:    Patient ID: Rachael Walker, female    DOB: Jul 06, 1960, 55 y.o.   MRN: [id number]  HPI chief complaint: Bilateral knee pain, right greater than left  Very pleasant 55 year old runner comes in today complaining of bilateral knee pain. She has had intermittent pain in the past but this past weekend she had an acute onset of pain after a 6 mile run on Sunday. Patient states that Monday morning she was having great difficulty getting around. She took 3 Advil and her symptoms resolved. Her pain is in the anterior knee. It is worse with prolonged sitting and tends to improve if she straightens out her leg. In addition to anterior knee pain she is also getting some lateral knee pain on the left. No swelling. No history of trauma. No prior knee surgeries. No mechanical symptoms. No numbness or tingling. She is concerned that she may be developing arthritis in her knees.  Past medical history is reviewed Medications are reviewed Allergies are reviewed    Review of Systems As above    Objective:   Physical Exam  Well-developed, fit appearing. No acute distress. Awake alert and oriented 3. Vital signs reviewed  Examination of each knee shows full range of motion. No effusion. No soft tissue swelling. Trace patellofemoral crepitus bilaterally. Negative patellar compression test. No tenderness to palpation along medial or lateral joint lines. Negative McMurray's. Negative Thessaly's. Knees are grossly stable to ligamentous exam. There is some tenderness to palpation along the left distal IT band and the patient demonstrates hip weakness with resisted abduction. Neurovascularly intact distally. Walking without a limp.  X-rays of both knees including AP, lateral, and sunrise views are reviewed. There is a possibility of degenerative changes. Patellofemoral joint is well-preserved. There is a slight lateral tilt to the left patella on the sunrise view.      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 She is reassured that she does not have any significant arthritis on her x-rays. I think she is okay to continue with intermittent doses of over-the-counter NSAIDs as needed. I also think she is okay to continue with all activity, including running, without restriction. If symptoms persist or worsen, we could consider merits of an intra-articular cortisone injection or formal physical therapy. She will follow-up for ongoing or recalcitrant issues.

## 2016-06-15 HISTORY — PX: COLONOSCOPY: SHX174

## 2016-12-10 ENCOUNTER — Encounter: Payer: Self-pay | Admitting: Sports Medicine

## 2016-12-10 ENCOUNTER — Ambulatory Visit: Payer: Self-pay

## 2016-12-10 ENCOUNTER — Ambulatory Visit (INDEPENDENT_AMBULATORY_CARE_PROVIDER_SITE_OTHER): Payer: Managed Care, Other (non HMO) | Admitting: Sports Medicine

## 2016-12-10 VITALS — BP 112/70 | Ht 67.0 in | Wt 145.0 lb

## 2016-12-10 DIAGNOSIS — M25551 Pain in right hip: Secondary | ICD-10-CM

## 2016-12-10 NOTE — Progress Notes (Signed)
   Subjective:    Patient ID: Rachael Walker, female    DOB: 1961/05/12, 56 y.o.   MRN: [id number]  HPI  Ms. Nienow is a 56 year old female who presents with right hip pain. Patient reports she started having back, right hip and knee pain a few months ago. Denies any specific injury. She went to a trainer who recommended stretching exercises which helped her back and knee but not her hip. Her right hip pain is located in the posterolateral region (near the iliac crest) and describes as a "grinding" sensation or achy sensation. This is mainly with running, and sometimes she notices stiffness in the area with prolonged sitting. Pain does not radiate. She is unable to run for a prolonged time and she has to mix jogging with running due to the pain. Advil does help with her symptoms. She tried different insoles with did not make much of a difference.   Review of Systems No lower extremity weakness, numbness, or tingling     Objective:   Physical Exam Gen: NAD BP 112/70   Ht 5\' 7"  (1.702 m)   Wt 145 lb (65.8 kg)   BMI 22.71 kg/m   Gait:  Mild valgus knee alignment with ambulation  No rotation of the pelvis with running  Broad based foot strike  Back:  no tenderness to palpation Normal Flexion, Extension, lateral bend and lateral rotation without any discomfort  Right Hip:  no tenderness to palpation  Good range of motion of the hip (flexion, extension, abduction, adduction, internal and external rotation Normal strength (flexion, extension, abduction, adduction)  Normal Faber   Limited Ultrasound of the Right Hip:   Spur of the right iliac crest  Hyperechoic soft tissue attachment Hypoechoic region at the intersection of gluteus medius and gluteus maximus consistent with some edema.  Otherwise musculature appears normal. No bursal swelling at greater trochanter  Impression: strain of fascial attachment to posterior iliac crest  Ultrasound and interpretation by Wolfgang Phoenix. Scharlene Catalina,  MD     Assessment & Plan:  Right Hip Pain:  Likely due to biomechanics - provided stretching and strengthening exercises of the hips and obliques (pretzel stretch, crossover stretch, side planks, T exercises. Additionally reviewed standing hip rotation, lateral step and crossover lateral step) - line drills to improve running form  - follow up 6 weeks PRN   Smiley Houseman, MD PGY 2 Family Medicine  I observed and examined the patient with the resident and agree with assessment and plan.  Note reviewed and modified by me. Stefanie Libel, MD

## 2016-12-10 NOTE — Assessment & Plan Note (Signed)
We will try gait retraining  Stretches  HEP  If not improved by 6 weeks consider injection to area

## 2016-12-10 NOTE — Patient Instructions (Signed)
Exercises/Stretches:  - pretzel stretch (can do on a couch) - crossover stretch  - side planks - T exercises (lateral T and rotated T) with dumbbells (hold for 5 seconds each)  - Line drills (to help with running form)   Other Exercises that you may benefit from:  Standing hip rotation Lateral (side) step Crossover step

## 2017-01-06 ENCOUNTER — Encounter: Payer: Self-pay | Admitting: Gastroenterology

## 2017-03-01 ENCOUNTER — Ambulatory Visit (AMBULATORY_SURGERY_CENTER): Payer: Self-pay | Admitting: *Deleted

## 2017-03-01 VITALS — Ht 67.0 in | Wt 160.0 lb

## 2017-03-01 DIAGNOSIS — Z1211 Encounter for screening for malignant neoplasm of colon: Secondary | ICD-10-CM

## 2017-03-01 DIAGNOSIS — Z8371 Family history of colonic polyps: Secondary | ICD-10-CM

## 2017-03-01 MED ORDER — SOD PICOSULFATE-MAG OX-CIT ACD 10-3.5-12 MG-GM-GM PO PACK
PACK | ORAL | 0 refills | Status: DC
Start: 2017-03-01 — End: 2017-03-10

## 2017-03-01 NOTE — Progress Notes (Addendum)
Patient denies any allergies to eggs or soy. Patient denies any problems with anesthesia/sedation. Patient denies any oxygen use at home and does not take any diet/weight loss medications. EMMI education assisgned to patient on colonoscopy, this was explained and instructions given to patient. Patient requested a different prep, she states she had a hard time getting down the Miralax prep and she was not able to finish this. Patient states the prep is reason she has not been back to have the recall colon. Explained the Suprep and split dose Miralax and patient still feels she would not be able to drink them. Offered Osmoprep and she feels she would not be able to take the big pills. Prepopik then explained, patient is agreeable to take this. Explained the need to be cleaned out and to call us that night if the prep is not working. Patient understands!

## 2017-03-03 ENCOUNTER — Encounter: Payer: Self-pay | Admitting: Gastroenterology

## 2017-03-10 ENCOUNTER — Encounter: Payer: Self-pay | Admitting: Gastroenterology

## 2017-03-10 ENCOUNTER — Ambulatory Visit (AMBULATORY_SURGERY_CENTER): Payer: Managed Care, Other (non HMO) | Admitting: Gastroenterology

## 2017-03-10 VITALS — BP 111/79 | HR 52 | Temp 97.7°F | Resp 11 | Ht 67.0 in | Wt 160.0 lb

## 2017-03-10 DIAGNOSIS — Z1211 Encounter for screening for malignant neoplasm of colon: Secondary | ICD-10-CM | POA: Diagnosis not present

## 2017-03-10 DIAGNOSIS — K621 Rectal polyp: Secondary | ICD-10-CM

## 2017-03-10 DIAGNOSIS — D128 Benign neoplasm of rectum: Secondary | ICD-10-CM

## 2017-03-10 MED ORDER — SODIUM CHLORIDE 0.9 % IV SOLN: 500.0000 mL | INTRAVENOUS | Status: DC

## 2017-03-10 NOTE — Progress Notes (Signed)
Pt's states no medical or surgical changes since previsit or office visit. 

## 2017-03-10 NOTE — Op Note (Signed)
[facility] Patient Name: Naomia Lenderman Procedure Date: 03/10/2017 8:39 AM MRN: [id number] Endoscopist: Mauri Pole , MD Age: 56 Referring MD:  Date of Birth: [birth year] Gender: Female Account #: [id number] Procedure:                Colonoscopy Indications:              Screening for colorectal malignant neoplasm Medicines:                Monitored Anesthesia Care Procedure:                Pre-Anesthesia Assessment:                           - Prior to the procedure, a History and Physical                            was performed, and patient medications and                            allergies were reviewed. The patient's tolerance of                            previous anesthesia was also reviewed. The risks                            and benefits of the procedure and the sedation                            options and risks were discussed with the patient.                            All questions were answered, and informed consent                            was obtained. Prior Anticoagulants: The patient has                            taken no previous anticoagulant or antiplatelet                            agents. ASA Grade Assessment: I - A normal, healthy                            patient. After reviewing the risks and benefits,                            the patient was deemed in satisfactory condition to                            undergo the procedure.                           After obtaining informed consent, the colonoscope  was passed under direct vision. Throughout the                            procedure, the patient's blood pressure, pulse, and                            oxygen saturations were monitored continuously. The                            Model PCF-H190DL [id number]) scope was introduced                            through the anus and advanced to the the cecum,                            identified by  appendiceal orifice and ileocecal                            valve. The colonoscopy was performed without                            difficulty. The patient tolerated the procedure                            well. The quality of the bowel preparation was                            adequate. The terminal ileum, ileocecal valve,                            appendiceal orifice, and rectum were photographed. Scope In: 8:49:24 AM Scope Out: 9:15:02 AM Scope Withdrawal Time: 0 hours 18 minutes 4 seconds  Total Procedure Duration: 0 hours 25 minutes 38 seconds  Findings:                 The perianal and digital rectal examinations were                            normal.                           A 4 mm polyp was found in the rectum. The polyp was                            sessile. The polyp was removed with a cold snare.                            Resection and retrieval were complete.                           Non-bleeding internal hemorrhoids were found during                            retroflexion. The hemorrhoids were small.  The exam was otherwise without abnormality. Complications:            No immediate complications. Estimated Blood Loss:     Estimated blood loss was minimal. Impression:               - One 4 mm polyp in the rectum, removed with a cold                            snare. Resected and retrieved.                           - Non-bleeding internal hemorrhoids.                           - The examination was otherwise normal. Recommendation:           - Patient has a contact number available for                            emergencies. The signs and symptoms of potential                            delayed complications were discussed with the                            patient. Return to normal activities tomorrow.                            Written discharge instructions were provided to the                            patient.                            - Resume previous diet.                           - Continue present medications.                           - Await pathology results.                           - Repeat colonoscopy in 5-10 years for surveillance                            based on pathology results. Mauri Pole, MD 03/10/2017 9:19:26 AM This report has been signed electronically.

## 2017-03-10 NOTE — Progress Notes (Signed)
Report to PACU, RN, vss, BBS= Clear.  

## 2017-03-10 NOTE — Patient Instructions (Signed)
Discharge instructions given. Handouts on polyps and hemorrhoids. Resume previous medications. YOU HAD AN ENDOSCOPIC PROCEDURE TODAY AT THE [facility]:   Refer to the procedure report that was given to you for any specific questions about what was found during the examination.  If the procedure report does not answer your questions, please call your gastroenterologist to clarify.  If you requested that your care partner not be given the details of your procedure findings, then the procedure report has been included in a sealed envelope for you to review at your convenience later.  YOU SHOULD EXPECT: Some feelings of bloating in the abdomen. Passage of more gas than usual.  Walking can help get rid of the air that was put into your GI tract during the procedure and reduce the bloating. If you had a lower endoscopy (such as a colonoscopy or flexible sigmoidoscopy) you may notice spotting of blood in your stool or on the toilet paper. If you underwent a bowel prep for your procedure, you may not have a normal bowel movement for a few days.  Please Note:  You might notice some irritation and congestion in your nose or some drainage.  This is from the oxygen used during your procedure.  There is no need for concern and it should clear up in a day or so.  SYMPTOMS TO REPORT IMMEDIATELY:   Following lower endoscopy (colonoscopy or flexible sigmoidoscopy):  Excessive amounts of blood in the stool  Significant tenderness or worsening of abdominal pains  Swelling of the abdomen that is new, acute  Fever of 100F or higher   For urgent or emergent issues, a gastroenterologist can be reached at any hour by calling [phone].   DIET:  We do recommend a small meal at first, but then you may proceed to your regular diet.  Drink plenty of fluids but you should avoid alcoholic beverages for 24 hours.  ACTIVITY:  You should plan to take it easy for the rest of today and you should NOT DRIVE  or use heavy machinery until tomorrow (because of the sedation medicines used during the test).    FOLLOW UP: Our staff will call the number listed on your records the next business day following your procedure to check on you and address any questions or concerns that you may have regarding the information given to you following your procedure. If we do not reach you, we will leave a message.  However, if you are feeling well and you are not experiencing any problems, there is no need to return our call.  We will assume that you have returned to your regular daily activities without incident.  If any biopsies were taken you will be contacted by phone or by letter within the next 1-3 weeks.  Please call us at [phone] if you have not heard about the biopsies in 3 weeks.    SIGNATURES/CONFIDENTIALITY: You and/or your care partner have signed paperwork which will be entered into your electronic medical record.  These signatures attest to the fact that that the information above on your After Visit Summary has been reviewed and is understood.  Full responsibility of the confidentiality of this discharge information lies with you and/or your care-partner. 

## 2017-03-10 NOTE — Progress Notes (Signed)
Called to room to assist during endoscopic procedure.  Patient ID and intended procedure confirmed with present staff. Received instructions for my participation in the procedure from the performing physician.  

## 2017-03-11 ENCOUNTER — Telehealth: Payer: Self-pay

## 2017-03-11 NOTE — Telephone Encounter (Signed)
No answer, left message to call if questions or concerns. 

## 2017-03-12 NOTE — Progress Notes (Signed)
[facility] [address] [place], [place] [address] [phone]  Date:  03/15/2017   Name:  Rachael Walker   DOB:  [birth year]   MRN:  [id number]  PCP:  Maisie Fus, MD    Chief Complaint: Establish Care (Pt here to est care. Would like flu vaccine today. )   History of Present Illness:  Rachael Walker is a 56 y.o. very pleasant female patient who presents with the following:  Here today as a new patient to [facility], but I have seen her in the past at [facility].  She has not generally had a PCP but would like to establish as she is getting a bit older She sees OBG- Dr. Nori Riis, but he is planning to retire in the next few years   Dr. Nori Riis stopped her OCP about a year ago- he prescribed an oral HRT and premarin cream but she did not end up filling thse due to cost She notes bothersome vaginal dryness and would be interested in a local estrogen vaginally  She has noted a right ring finger trigger finger a couple of weeks ago- it has not yet resolved  Last bone density done by Dr. Verlon Au office- her last report showed some "deterioration."  She has noted lower back pain for about one year - she wonders if this is due to her bone density issues She also notes that her neck and thoracic spine can be stiff and painful She also notes some numbness and tingling in her right hand- she is not sure if this is associated with her trigger finger or due to something else She did have hard boiled eggs this am  Never had any post- menopausal bleeding, blood clot or cancer.  Discussed possible risks of low dose vaginal estrogen Patient Active Problem List   Diagnosis Date Noted  . Posterior pain of right hip 12/10/2016  . BUNION, RIGHT FOOT 04/23/2010  . ABNORMALITY OF GAIT 04/23/2010  . UNEQUAL LEG LENGTH 03/31/2010  . PLANTAR FASCIITIS, BILATERAL 03/13/2010  . STRESS FRACTURE, FOOT 03/13/2010    No past medical history on  file.  Past Surgical History:  Procedure Laterality Date  . BUNIONECTOMY Right   . COLONOSCOPY  2009   w/[name]    Social History  Substance Use Topics  . Smoking status: Never Smoker  . Smokeless tobacco: Never Used  . Alcohol use Yes     Comment: occ. -2 times a month per pt    Family History  Problem Relation Age of Onset  . Osteoporosis Mother   . Colon polyps Mother   . Cancer Father   . Diabetes Father   . Hyperlipidemia Father   . Colon polyps Brother   . Cancer Maternal Grandmother        kidney  . Heart disease Maternal Grandfather   . Diabetes Paternal Grandmother   . Diabetes Brother   . Colon cancer Neg Hx     No Known Allergies  Medication list has been reviewed and updated.  No current outpatient prescriptions on file prior to visit.   Current Facility-Administered Medications on File Prior to Visit  Medication Dose Route Frequency Provider Last Rate Last Dose  . 0.9 %  sodium chloride infusion  500 mL Intravenous Continuous Nandigam, Venia Minks, MD        Review of Systems:  As per HPI- otherwise negative.   Physical Examination: Vitals:   03/15/17  1216  BP: 110/82  Pulse: 71  Temp: 97.7 F (36.5 C)  SpO2: 97%   Vitals:   03/15/17 1216  Weight: 154 lb 9.6 oz (70.1 kg)  Height: 5' 7.5" (1.715 m)   Body mass index is 23.86 kg/m. Ideal Body Weight: Weight in (lb) to have BMI = 25: 161.7  GEN: WDWN, NAD, Non-toxic, A & O x 3, normal weight, athletic build, looks well HEENT: Atraumatic, Normocephalic. Neck supple. No masses, No LAD. Ears and Nose: No external deformity. CV: RRR, No M/G/R. No JVD. No thrill. No extra heart sounds. PULM: CTA B, no wheezes, crackles, rhonchi. No retractions. No resp. distress. No accessory muscle use. ABD: S, NT, ND, +BS. No rebound. No HSM. EXTR: No c/c/e NEURO Normal gait.  PSYCH: Normally interactive. Conversant. Not depressed or anxious appearing.  Calm demeanor.  She does have a probably trigger  finger on the right ring Dg Cervical Spine 2 Or 3 Views  Result Date: 03/15/2017 CLINICAL DATA:  Initial evaluation for right-sided neck and upper back pain for several months. EXAM: CERVICAL SPINE - 2-3 VIEW COMPARISON:  None. FINDINGS: There is no evidence of cervical spine fracture or prevertebral soft tissue swelling. Alignment is normal. No other significant bone abnormalities are identified. Disc spaces maintained. IMPRESSION: Negative cervical spine radiographs. Electronically Signed   By: Jeannine Boga M.D.   On: 03/15/2017 15:53   Dg Thoracic Spine 2 View  Result Date: 03/15/2017 CLINICAL DATA:  Initial evaluation for pain in mid and lower back for several months. EXAM: THORACIC SPINE 2 VIEWS COMPARISON:  None. FINDINGS: Mild dextroscoliosis. Vertebral bodies otherwise normally aligned with preservation of the normal thoracic kyphosis. Vertebral body heights maintained. No evidence for acute or chronic fracture. Disc spaces well-preserved. No soft tissue abnormality. IMPRESSION: 1. No radiographic evidence for acute abnormality within the thoracic spine. 2. Mild dextroscoliosis without significant degenerative spondylolysis. Electronically Signed   By: Jeannine Boga M.D.   On: 03/15/2017 15:51   Dg Lumbar Spine Complete  Result Date: 03/15/2017 CLINICAL DATA:  Initial evaluation for pain in mid and lower back for several months. EXAM: LUMBAR SPINE - COMPLETE 4+ VIEW COMPARISON:  None. FINDINGS: Five non rib-bearing lumbar type vertebral bodies. Vertebral bodies normally aligned with preservation of the normal lumbar lordosis. No listhesis. Vertebral body heights maintained. No evidence for acute or chronic fracture. Degenerative intervertebral disc space narrowing with endplate sclerosis and osteophytosis present at L5-S1. Disc spaces otherwise maintained. No soft tissue abnormality. IMPRESSION: 1. No acute abnormality within the lumbar spine. 2. Degenerative intervertebral disc  space narrowing at L5-S1. Electronically Signed   By: Jeannine Boga M.D.   On: 03/15/2017 15:49   Results for orders placed or performed in visit on 03/15/17  CBC  Result Value Ref Range   WBC 4.7 4.0 - 10.5 K/uL   RBC 4.56 3.87 - 5.11 Mil/uL   Platelets 176.0 150.0 - 400.0 K/uL   Hemoglobin 13.7 12.0 - 15.0 g/dL   HCT 41.2 36.0 - 46.0 %   MCV 90.3 78.0 - 100.0 fl   MCHC 33.2 30.0 - 36.0 g/dL   RDW 14.0 11.5 - 15.5 %  Comprehensive metabolic panel  Result Value Ref Range   Sodium 137 135 - 145 mEq/L   Potassium 4.3 3.5 - 5.1 mEq/L   Chloride 100 96 - 112 mEq/L   CO2 30 19 - 32 mEq/L   Glucose, Bld 94 70 - 99 mg/dL   BUN 21 6 - 23 mg/dL  Creatinine, Ser 0.81 0.40 - 1.20 mg/dL   Total Bilirubin 0.5 0.2 - 1.2 mg/dL   Alkaline Phosphatase 118 (H) 39 - 117 U/L   AST 30 0 - 37 U/L   ALT 32 0 - 35 U/L   Total Protein 6.8 6.0 - 8.3 g/dL   Albumin 4.4 3.5 - 5.2 g/dL   Calcium 9.9 8.4 - 10.5 mg/dL   GFR 77.68 >60.00 mL/min  Hemoglobin A1c  Result Value Ref Range   Hgb A1c MFr Bld 5.8 4.6 - 6.5 %  Lipid panel  Result Value Ref Range   Cholesterol 214 (H) 0 - 200 mg/dL   Triglycerides 67.0 0.0 - 149.0 mg/dL   HDL 89.00 >39.00 mg/dL   VLDL 13.4 0.0 - 40.0 mg/dL   LDL Cholesterol 112 (H) 0 - 99 mg/dL   Total CHOL/HDL Ratio 2    NonHDL 125.38     Assessment and Plan: Immunization due - Plan: Flu Vaccine QUAD 36+ mos IM (Fluarix & Fluzone Quad PF  Screening for deficiency anemia - Plan: CBC  Screening for hyperlipidemia - Plan: Lipid panel  Screening for diabetes mellitus - Plan: Comprehensive metabolic panel, Hemoglobin A1c  Trigger ring finger of right hand - Plan: Ambulatory referral to Sports Medicine  Chronic midline low back pain without sciatica - Plan: DG Lumbar Spine Complete  Chronic bilateral thoracic back pain - Plan: DG Thoracic Spine 2 View  Vaginal dryness - Plan: Estradiol 10 MCG TABS vaginal tablet  Numbness and tingling in right hand - Plan: DG  Cervical Spine 2 or 3 views  Here today to re-establish care and discuss a few concerns  Flu shot given today Referral to sports med regarding her trigger finger X-rays and basic labs pending rx for low dose vaginal estrogen to help with her vaginal dryness  Signed Lamar Blinks, MD

## 2017-03-15 ENCOUNTER — Ambulatory Visit (HOSPITAL_BASED_OUTPATIENT_CLINIC_OR_DEPARTMENT_OTHER)
Admission: RE | Admit: 2017-03-15 | Discharge: 2017-03-15 | Disposition: A | Discharge status: Home / Self Care | Payer: Managed Care, Other (non HMO) | Source: Ambulatory Visit | Attending: Family Medicine | Admitting: Family Medicine

## 2017-03-15 ENCOUNTER — Encounter: Payer: Self-pay | Admitting: Gastroenterology

## 2017-03-15 ENCOUNTER — Ambulatory Visit (INDEPENDENT_AMBULATORY_CARE_PROVIDER_SITE_OTHER): Payer: Managed Care, Other (non HMO) | Admitting: Family Medicine

## 2017-03-15 VITALS — BP 110/82 | HR 71 | Temp 97.7°F | Ht 67.5 in | Wt 154.6 lb

## 2017-03-15 DIAGNOSIS — G8929 Other chronic pain: Secondary | ICD-10-CM

## 2017-03-15 DIAGNOSIS — Z130 Encounter for screening for diseases of the blood and blood-forming organs and certain disorders involving the immune mechanism: Secondary | ICD-10-CM

## 2017-03-15 DIAGNOSIS — M5137 Other intervertebral disc degeneration, lumbosacral region: Secondary | ICD-10-CM | POA: Insufficient documentation

## 2017-03-15 DIAGNOSIS — M545 Low back pain, unspecified: Secondary | ICD-10-CM

## 2017-03-15 DIAGNOSIS — R200 Anesthesia of skin: Secondary | ICD-10-CM | POA: Insufficient documentation

## 2017-03-15 DIAGNOSIS — M65341 Trigger finger, right ring finger: Secondary | ICD-10-CM | POA: Diagnosis not present

## 2017-03-15 DIAGNOSIS — R202 Paresthesia of skin: Secondary | ICD-10-CM

## 2017-03-15 DIAGNOSIS — Z23 Encounter for immunization: Secondary | ICD-10-CM | POA: Diagnosis not present

## 2017-03-15 DIAGNOSIS — Z131 Encounter for screening for diabetes mellitus: Secondary | ICD-10-CM | POA: Diagnosis not present

## 2017-03-15 DIAGNOSIS — M546 Pain in thoracic spine: Secondary | ICD-10-CM | POA: Insufficient documentation

## 2017-03-15 DIAGNOSIS — N898 Other specified noninflammatory disorders of vagina: Secondary | ICD-10-CM | POA: Diagnosis not present

## 2017-03-15 DIAGNOSIS — Z13220 Encounter for screening for lipoid disorders: Secondary | ICD-10-CM

## 2017-03-15 LAB — COMPREHENSIVE METABOLIC PANEL
ALBUMIN: 4.4 g/dL (ref 3.5–5.2)
ALT: 32 U/L (ref 0–35)
AST: 30 U/L (ref 0–37)
Alkaline Phosphatase: 118 U/L — ABNORMAL HIGH (ref 39–117)
BUN: 21 mg/dL (ref 6–23)
CHLORIDE: 100 meq/L (ref 96–112)
CO2: 30 mEq/L (ref 19–32)
Calcium: 9.9 mg/dL (ref 8.4–10.5)
Creatinine, Ser: 0.81 mg/dL (ref 0.40–1.20)
GFR: 77.68 mL/min (ref >60.00)
Glucose, Bld: 94 mg/dL (ref 70–99)
POTASSIUM: 4.3 meq/L (ref 3.5–5.1)
SODIUM: 137 meq/L (ref 135–145)
Total Bilirubin: 0.5 mg/dL (ref 0.2–1.2)
Total Protein: 6.8 g/dL (ref 6.0–8.3)

## 2017-03-15 LAB — CBC
HEMATOCRIT: 41.2 % (ref 36.0–46.0)
Hemoglobin: 13.7 g/dL (ref 12.0–15.0)
MCHC: 33.2 g/dL (ref 30.0–36.0)
MCV: 90.3 fl (ref 78.0–100.0)
Platelets: 176 10*3/uL (ref 150.0–400.0)
RBC: 4.56 Mil/uL (ref 3.87–5.11)
RDW: 14 % (ref 11.5–15.5)
WBC: 4.7 10*3/uL (ref 4.0–10.5)

## 2017-03-15 LAB — LIPID PANEL
CHOL/HDL RATIO: 2
Cholesterol: 214 mg/dL — ABNORMAL HIGH (ref 0–200)
HDL: 89 mg/dL (ref >39.00)
LDL Cholesterol: 112 mg/dL — ABNORMAL HIGH (ref 0–99)
NONHDL: 125.38
Triglycerides: 67 mg/dL (ref 0.0–149.0)
VLDL: 13.4 mg/dL (ref 0.0–40.0)

## 2017-03-15 LAB — HEMOGLOBIN A1C: HEMOGLOBIN A1C: 5.8 % (ref 4.6–6.5)

## 2017-03-15 MED ORDER — ESTRADIOL 10 MCG VA TABS: ORAL_TABLET | VAGINAL | 6 refills | Status: DC

## 2017-03-15 NOTE — Patient Instructions (Signed)
I am going to refer you to sports med to look at your trigger finger for you We will get plain x-rays of your back today and I will be in touch with these results as well as your routine labs  You can use the estrogen tablets vaginally for dryness- use this up to 3x a week, but less often if sufficient to control symptoms

## 2017-03-26 ENCOUNTER — Ambulatory Visit (INDEPENDENT_AMBULATORY_CARE_PROVIDER_SITE_OTHER): Payer: Managed Care, Other (non HMO) | Admitting: Sports Medicine

## 2017-03-26 ENCOUNTER — Encounter: Payer: Self-pay | Admitting: Sports Medicine

## 2017-03-26 VITALS — BP 100/60 | Ht 67.0 in | Wt 145.0 lb

## 2017-03-26 DIAGNOSIS — M6530 Trigger finger, unspecified finger: Secondary | ICD-10-CM

## 2017-03-26 DIAGNOSIS — M545 Low back pain, unspecified: Secondary | ICD-10-CM

## 2017-03-26 MED ORDER — NAPROXEN 500 MG PO TABS: ORAL_TABLET | ORAL | 0 refills | Status: DC

## 2017-03-26 NOTE — Progress Notes (Signed)
   Subjective:    Patient ID: Rachael Walker, female    DOB: September 26, 1960, 56 y.o.   MRN: [id number]  HPI chief complaint: Right hand trigger finger  56 year old female comes in today complaining of 6 weeks of locking of her right ring finger. Locking only occurs at night while sleeping. She is able to pry the finger open upon awakening and does not have any other problems through the day. She has started to notice similar symptoms in her right thumb. She denies any prior occurrences. She denies any increase in activity. No numbness or tingling. It is slightly painful. No trauma. She is also complaining of several weeks of neck and low back pain. It is nonspecific but tends to occur more frequently at night when rolling over in bed. No numbness or tingling into her arms or legs. She's had x-rays done which were ordered by her PCP. They are available for my review. He is very active and enjoys running, biking, elliptical, and weight lifting.  Interim medical history reviewed Medications reviewed Allergies reviewed    Review of Systems    as above Objective:   Physical Exam  Well-developed, well-nourished. No acute distress. Awake alert and oriented 3. Vital signs reviewed  Right hand: Examination of the right ring finger shows full active and passive range of motion. No tenderness to palpation. No palpable nodule at the A1 pulley. No active locking or triggering. No swelling. She does have some slight triggering of the right thumb with active flexion and extension. Palpable nodule at the A1 pulley. Minimal tenderness to palpation here. Neurovascularly intact distally.  Patient has fairly good cervical, thoracic, and lumbar range of motion. No gross neurological deficits of either her upper or lower extremities seen.  X-rays of her cervical spine, thoracic spine, and lumbar spine are reviewed. She has some mild to moderate degenerative disc disease at L5-S1 as well as a slight scoliosis in  the thoracic spine. Otherwise unremarkable. Nothing acute.      Assessment & Plan:   Right ring finger trigger finger Mild to moderate L5-S1 degenerative disc disease  Since the patient's ring finger is not actively triggering in the office today, we will try a simple Band-Aid splint at the PIP joint while sleeping. She will do this for the next 2-3 weeks. I will also put her on naproxen sodium 500 mg twice daily with food for 7 days then as needed. Hopefully this will help with her low back pain as well. She may need to modify her exercises somewhat if her back pain is bothersome but otherwise I think she can continue with activity as tolerated. If her trigger finger persists or worsens then I would consider a cortisone injection. Follow-up for ongoing or recalcitrant issues.

## 2017-04-20 ENCOUNTER — Telehealth: Payer: Self-pay | Admitting: Family Medicine

## 2017-04-20 NOTE — Telephone Encounter (Signed)
Patient Name: Rachael Walker  DOB: 1960/07/25    Initial Comment vaginal bleeding    Nurse Assessment  Nurse: Leilani Merl, RN, Heather Date/Time (Eastern Time): 04/20/2017 10:22:29 AM  Confirm and document reason for call. If symptomatic, describe symptoms. ---caller states that she started with vaginal bleeding yesterday, she has not had a period in over a year. she is also having slight diff urinating.  Does the patient have any new or worsening symptoms? ---Yes  Will a triage be completed? ---Yes  Related visit to physician within the last 2 weeks? ---No  Does the PT have any chronic conditions? (i.e. diabetes, asthma, etc.) ---No  Is this a behavioral health or substance abuse call? ---No     Guidelines    Guideline Title Affirmed Question Affirmed Notes  Vaginal Bleeding - Postmenopausal Postmenopausal vaginal bleeding    Final Disposition User   See PCP within 2 Weeks Stand[place], RN, Water quality scientist    Referrals  REFERRED TO PCP OFFICE   Caller Disagree/Comply Comply  Caller Understands Yes  PreDisposition Call Doctor

## 2017-04-21 NOTE — Telephone Encounter (Signed)
TCM follow up call made to patient. Left message on her answering machine regarding appointment with Dr. Lorelei Pont if needed.

## 2017-06-08 ENCOUNTER — Emergency Department (HOSPITAL_BASED_OUTPATIENT_CLINIC_OR_DEPARTMENT_OTHER)
Admit: 2017-06-08 | Discharge: 2017-06-08 | Disposition: A | Discharge status: Home / Self Care | Payer: Managed Care, Other (non HMO)

## 2017-06-08 ENCOUNTER — Emergency Department (HOSPITAL_COMMUNITY)
Admission: EM | Admit: 2017-06-08 | Discharge: 2017-06-08 | Disposition: A | Discharge status: Home / Self Care | Payer: Managed Care, Other (non HMO) | Attending: Emergency Medicine | Admitting: Emergency Medicine

## 2017-06-08 ENCOUNTER — Encounter (HOSPITAL_COMMUNITY): Payer: Self-pay | Admitting: Emergency Medicine

## 2017-06-08 DIAGNOSIS — M79662 Pain in left lower leg: Secondary | ICD-10-CM | POA: Diagnosis present

## 2017-06-08 DIAGNOSIS — Z79899 Other long term (current) drug therapy: Secondary | ICD-10-CM | POA: Insufficient documentation

## 2017-06-08 DIAGNOSIS — R252 Cramp and spasm: Secondary | ICD-10-CM | POA: Insufficient documentation

## 2017-06-08 DIAGNOSIS — M79609 Pain in unspecified limb: Secondary | ICD-10-CM

## 2017-06-08 LAB — BASIC METABOLIC PANEL
ANION GAP: 6 (ref 5–15)
BUN: 16 mg/dL (ref 6–20)
CHLORIDE: 105 mmol/L (ref 101–111)
CO2: 26 mmol/L (ref 22–32)
Calcium: 9.3 mg/dL (ref 8.9–10.3)
Creatinine, Ser: 0.72 mg/dL (ref 0.44–1.00)
GFR calc non Af Amer: >60 mL/min (ref >60)
GLUCOSE: 101 mg/dL — AB (ref 65–99)
POTASSIUM: 3.7 mmol/L (ref 3.5–5.1)
Sodium: 137 mmol/L (ref 135–145)

## 2017-06-08 MED ORDER — NAPROXEN 500 MG PO TABS: 500.0000 mg | ORAL_TABLET | Freq: Two times a day (BID) | ORAL | 0 refills | Status: DC | PRN

## 2017-06-08 MED ORDER — CYCLOBENZAPRINE HCL 10 MG PO TABS: 10.0000 mg | ORAL_TABLET | Freq: Three times a day (TID) | ORAL | 0 refills | Status: DC | PRN

## 2017-06-08 NOTE — ED Notes (Signed)
Pt does not want to get vitals. Pt says she is fine and wants to get home to family. Pt is being discharged.

## 2017-06-08 NOTE — Progress Notes (Signed)
Left lower extremity venous duplex has been completed. Negative for DVT. Results were given to [facility] PA.  06/08/17 5:36 PM Carlos Levering RVT

## 2017-06-08 NOTE — ED Notes (Signed)
ED PA at bedside

## 2017-06-08 NOTE — ED Provider Notes (Signed)
[facility] DEPT Provider Note   CSN: [id number] Arrival date & time: 06/08/17  1531     History   Chief Complaint Chief Complaint  Patient presents with  . Leg Pain    HPI Rachael Walker is a 56 y.o. female with a PMHx of plantar fasciitis, who presents to the ED with complaints of left calf cramp that occurred about 2.5 hours prior to arrival around 1:30 PM.  Patient states that she was walking briskly exercising when she developed a cramp, she was able to make it home but the cramping did not go away.  After 2 hours when the cramps had still not gone completely away, her family convinced her to come to the emergency room for further evaluation.  She describes the pain as 2/10 constant cramping and tightness in the left calf, nonradiating, worse with walking, and mildly improved with warm bath and Gatorade consumption.  She denies any leg swelling, erythema or warmth to the calf, fevers, chills, CP, SOB, abd pain, N/V/D/C, hematuria, dysuria, arthralgias, numbness, tingling, focal weakness, or any other complaints at this time.  She denies any recent travel, surgeries, or prolonged immobilization.  She denies personal history of DVT or PE, however her mother had a DVT.  She is on estradiol 10 mcg as needed for vaginal dryness, last use was 1 week ago when she used 1 pill intravaginally.  PCP Dr. Lorelei Pont at [place].    The history is provided by the patient and medical records. No language interpreter was used.  Leg Pain   This is a new problem. The current episode started 1 to 2 hours ago. The problem occurs constantly. The problem has been gradually improving. The pain is present in the left lower leg. Quality: cramping tightness. The pain is at a severity of 2/10. The pain is mild. Pertinent negatives include no numbness, full range of motion and no tingling. The symptoms are aggravated by activity. She has tried heat (and gatorade consumption) for the  symptoms. The treatment provided mild relief. There has been no history of extremity trauma.    History reviewed. No pertinent past medical history.  Patient Active Problem List   Diagnosis Date Noted  . Posterior pain of right hip 12/10/2016  . BUNION, RIGHT FOOT 04/23/2010  . ABNORMALITY OF GAIT 04/23/2010  . UNEQUAL LEG LENGTH 03/31/2010  . PLANTAR FASCIITIS, BILATERAL 03/13/2010  . STRESS FRACTURE, FOOT 03/13/2010    Past Surgical History:  Procedure Laterality Date  . BUNIONECTOMY Right   . COLONOSCOPY  2009   w/[name]    OB History    No data available       Home Medications    Prior to Admission medications   Medication Sig Start Date End Date Taking? Authorizing Provider  Estradiol 10 MCG TABS vaginal tablet Place 1 tablet vaginally 1-3x a week as needed to prevent dryness 03/15/17   Copland, Gay Filler, MD  naproxen (NAPROSYN) 500 MG tablet Take one pill 2 times daily with food for 7 days. Then take as needed. 03/26/17   Thurman Coyer, DO    Family History Family History  Problem Relation Age of Onset  . Osteoporosis Mother   . Colon polyps Mother   . Cancer Father   . Diabetes Father   . Hyperlipidemia Father   . Colon polyps Brother   . Cancer Maternal Grandmother        kidney  . Heart disease Maternal Grandfather   . Diabetes  Paternal Grandmother   . Diabetes Brother   . Colon cancer Neg Hx     Social History Social History   Tobacco Use  . Smoking status: Never Smoker  . Smokeless tobacco: Never Used  Substance Use Topics  . Alcohol use: Yes    Comment: occ. -2 times a month per pt  . Drug use: No     Allergies   Patient has no known allergies.   Review of Systems Review of Systems  Constitutional: Negative for chills and fever.  Respiratory: Negative for shortness of breath.   Cardiovascular: Negative for chest pain and leg swelling.  Gastrointestinal: Negative for abdominal pain, constipation, diarrhea, nausea and vomiting.    Genitourinary: Negative for dysuria and hematuria.  Musculoskeletal: Positive for myalgias. Negative for arthralgias and joint swelling.  Skin: Negative for color change.  Allergic/Immunologic: Negative for immunocompromised state.  Neurological: Negative for tingling, weakness and numbness.  Psychiatric/Behavioral: Negative for confusion.   All other systems reviewed and are negative for acute change except as noted in the HPI.    Physical Exam Updated Vital Signs BP (!) 124/99 (BP Location: Left Arm)   Pulse 62   Temp 97.8 F (36.6 C) (Oral)   Resp 16   LMP 02/24/2015 Comment: per pt doesn't last long, it's was for 1 day  SpO2 98%   Physical Exam  Constitutional: She is oriented to person, place, and time. Vital signs are normal. She appears well-developed and well-nourished.  Non-toxic appearance. No distress.  Afebrile, nontoxic, NAD  HENT:  Head: Normocephalic and atraumatic.  Mouth/Throat: Oropharynx is clear and moist and mucous membranes are normal.  Eyes: Conjunctivae and EOM are normal. Right eye exhibits no discharge. Left eye exhibits no discharge.  Neck: Normal range of motion. Neck supple.  Cardiovascular: Normal rate, regular rhythm, normal heart sounds and intact distal pulses. Exam reveals no gallop and no friction rub.  No murmur heard. Pulmonary/Chest: Effort normal and breath sounds normal. No respiratory distress. She has no decreased breath sounds. She has no wheezes. She has no rhonchi. She has no rales.  Abdominal: Soft. Normal appearance and bowel sounds are normal. She exhibits no distension. There is no tenderness. There is no rigidity, no rebound, no guarding, no CVA tenderness, no tenderness at McBurney's point and negative Murphy's sign.  Musculoskeletal: Normal range of motion.       Left lower leg: She exhibits tenderness. She exhibits no bony tenderness, no swelling, no edema, no deformity and no laceration.       Legs: L calf with mild TTP to  mid-calf, with slight spasm palpable in this area, no pedal edema or swelling, no skin discoloration, FROM intact in all major joints, strength and sensation grossly intact, distal pulses intact, soft compartments.   Neurological: She is alert and oriented to person, place, and time. She has normal strength. No sensory deficit.  Skin: Skin is warm, dry and intact. No rash noted.  Psychiatric: She has a normal mood and affect.  Nursing note and vitals reviewed.    ED Treatments / Results  Labs (all labs ordered are listed, but only abnormal results are displayed) Labs Reviewed  BASIC METABOLIC PANEL - Abnormal; Notable for the following components:      Result Value   Glucose, Bld 101 (*)    All other components within normal limits    EKG  EKG Interpretation None       Radiology No results found.   DVT U/S LLE 06/08/17:  Left lower extremity venous duplex has been completed. Negative for DVT. Results were given to [facility] PA.  06/08/17 5:36 PM Carlos Levering RVT     Procedures Procedures (including critical care time)  Medications Ordered in ED Medications - No data to display   Initial Impression / Assessment and Plan / ED Course  I have reviewed the triage vital signs and the nursing notes.  Pertinent labs & imaging results that were available during my care of the patient were reviewed by me and considered in my medical decision making (see chart for details).     56 y.o. female here with LLE cramp x2.5hrs, was walking when this occurred but the cramp hasn't gone away yet. She's on estradiol 27mcg intravaginally PRN for vaginal dryness, last use was 1wk ago. +FHx of DVT in mother. On exam, no LE swelling, no tachycardia or hypoxia, mild tenderness to L calf with slight spasm palpable, no skin discoloration, NVI with soft compartments. Will get BMP to check lytes, and get DVT U/S to r/o DVT. Pt declines wanting anything for pain. Will reassess shortly.    5:44 PM BMP WNL. DVT U/S negative. Likely just muscle cramp/spasm. Advised adequate hydration, ice/heat use, tylenol use, will rx naprosyn/flexeril to help with symptoms. Advised f/up with PCP in 1wk for recheck. I explained the diagnosis and have given explicit precautions to return to the ER including for any other new or worsening symptoms. The patient understands and accepts the medical plan as it's been dictated and I have answered their questions. Discharge instructions concerning home care and prescriptions have been given. The patient is STABLE and is discharged to home in good condition.    Final Clinical Impressions(s) / ED Diagnoses   Final diagnoses:  Calf cramp    ED Discharge Orders        Ordered    naproxen (NAPROSYN) 500 MG tablet  2 times daily PRN     06/08/17 1743    cyclobenzaprine (FLEXERIL) 10 MG tablet  3 times daily PRN     06/08/17 [address], [place], [place] 06/08/17 1744    Quintella Reichert, MD 06/09/17 [phone]

## 2017-06-08 NOTE — Discharge Instructions (Signed)
Stay very well hydrated, drink gatorade or powerade to help replenish electrolytes when exercising. Take naprosyn as directed for inflammation and pain with tylenol for breakthrough pain and flexeril for muscle relaxation. Do not drive or operate machinery with muscle relaxant use. Ice to areas of soreness for the next 24 hours and then may move to heat, no more than 20 minutes at a time every hour for each. Follow up with your primary care physician for recheck of ongoing symptoms in the next 1 week. Return to ER for emergent changing or worsening of symptoms.

## 2017-06-08 NOTE — ED Triage Notes (Signed)
Patient c/o cramp to left calf x1 hour. Pulse present in left foot. Denies recent travel.

## 2017-06-23 ENCOUNTER — Other Ambulatory Visit: Payer: Self-pay | Admitting: Emergency Medicine

## 2017-06-23 DIAGNOSIS — N898 Other specified noninflammatory disorders of vagina: Secondary | ICD-10-CM

## 2017-06-23 MED ORDER — ESTRADIOL 10 MCG VA TABS: ORAL_TABLET | VAGINAL | 6 refills | Status: DC

## 2018-06-13 ENCOUNTER — Ambulatory Visit: Payer: Self-pay | Admitting: *Deleted

## 2018-06-13 NOTE — Telephone Encounter (Signed)
Message from Esaw Dace sent at 06/13/2018 11:09 AM EST   Summary: Rash on Face   Pt stated she noticed a rash starting to form on the right side of her face on 06/11/18 by the jaw. She stated it feels rough and rough and warm to touch. It doesn't bother her and it's not itchy. She's not sure if it is an allergic reaction to something. Please advise. [phone]          Returned call to patient regarding a rash on her face. First noticed the rash on Saturday and now the puffiness on Sunday to her face. It is on the right side of her face. Denies itching, pain, or fever She took Benadryl last night. The rash is rough, and scaley like dry skin and red. There is a pronounced"pocket" to the right side of her mouth. Appointment scheduled.  Routing to flow at [facility] at [facility]. Advised to call back for increase in rash, difficulty breathing or swallowing. Pt voiced understanding.  Reason for Disposition . Mild localized rash  Answer Assessment - Initial Assessment Questions 1. APPEARANCE of RASH: "Describe the rash."      Rough area, looks like dry skin 2. LOCATION: "Where is the rash located?"      Right side of face near mouth 3. NUMBER: "How many spots are there?"      n/a 4. SIZE: "How big are the spots?" (Inches, centimeters or compare to size of a coin)      n/a 5. ONSET: "When did the rash start?"      Noticed on Saturday 6. ITCHING: "Does the rash itch?" If so, ask: "How bad is the itch?"  (Scale 1-10; or mild, moderate, severe)     No itching 7. PAIN: "Does the rash hurt?" If so, ask: "How bad is the pain?"  (Scale 1-10; or mild, moderate, severe)     No pain 8. OTHER SYMPTOMS: "Do you have any other symptoms?" (e.g., fever)     none 9. PREGNANCY: "Is there any chance you are pregnant?" "When was your last menstrual period?"     n/a  Protocols used: RASH OR REDNESS - LOCALIZED-A-AH

## 2018-06-15 NOTE — Progress Notes (Deleted)
[employer] at [facility] [address], [place], [place] [address] [phone]  Date:  06/16/2018   Name:  Rachael Walker   DOB:  08/17/1960   MRN:  [id number]  PCP:  Darreld Mclean, MD    Chief Complaint: No chief complaint on file.   History of Present Illness:  Rachael Walker is a 58 y.o. very pleasant female patient who presents with the following:  Generally healthy patient here today with concern of a facial rash Last seen by myself a little over a year ago for vaginal dryness-we had her try a low-dose of vaginal estrogen cream  Mammogram: Flu shot: Hep C screening/routine labs were over a year ago, can do labs today if she would like Patient Active Problem List   Diagnosis Date Noted  . Posterior pain of right hip 12/10/2016  . BUNION, RIGHT FOOT 04/23/2010  . ABNORMALITY OF GAIT 04/23/2010  . UNEQUAL LEG LENGTH 03/31/2010  . PLANTAR FASCIITIS, BILATERAL 03/13/2010  . STRESS FRACTURE, FOOT 03/13/2010    No past medical history on file.  Past Surgical History:  Procedure Laterality Date  . BUNIONECTOMY Right   . COLONOSCOPY  2009   w/[name]    Social History   Tobacco Use  . Smoking status: Never Smoker  . Smokeless tobacco: Never Used  Substance Use Topics  . Alcohol use: Yes    Comment: occ. -2 times a month per pt  . Drug use: No    Family History  Problem Relation Age of Onset  . Osteoporosis Mother   . Colon polyps Mother   . Cancer Father   . Diabetes Father   . Hyperlipidemia Father   . Colon polyps Brother   . Cancer Maternal Grandmother        kidney  . Heart disease Maternal Grandfather   . Diabetes Paternal Grandmother   . Diabetes Brother   . Colon cancer Neg Hx     No Known Allergies  Medication list has been reviewed and updated.  Current Outpatient Medications on File Prior to Visit  Medication Sig Dispense Refill  . cyclobenzaprine (FLEXERIL) 10 MG tablet Take 1  tablet (10 mg total) by mouth 3 (three) times daily as needed for muscle spasms. 15 tablet 0  . Estradiol 10 MCG TABS vaginal tablet Place 1 tablet vaginally 1-3x a week as needed to prevent dryness 12 tablet 6  . naproxen (NAPROSYN) 500 MG tablet Take one pill 2 times daily with food for 7 days. Then take as needed. 40 tablet 0  . naproxen (NAPROSYN) 500 MG tablet Take 1 tablet (500 mg total) by mouth 2 (two) times daily as needed for mild pain, moderate pain or headache (TAKE WITH MEALS.). 20 tablet 0   No current facility-administered medications on file prior to visit.     Review of Systems:  As per HPI- otherwise negative.    Physical Examination: There were no vitals filed for this visit. There were no vitals filed for this visit. There is no height or weight on file to calculate BMI. Ideal Body Weight:    GEN: WDWN, NAD, Non-toxic, A & O x 3 HEENT: Atraumatic, Normocephalic. Neck supple. No masses, No LAD. Ears and Nose: No external deformity. CV: RRR, No M/G/R. No JVD. No thrill. No extra heart sounds. PULM: CTA B, no wheezes, crackles, rhonchi. No retractions. No resp. distress. No accessory muscle use. ABD: S, NT, ND, +BS. No  rebound. No HSM. EXTR: No c/c/e NEURO Normal gait.  PSYCH: Normally interactive. Conversant. Not depressed or anxious appearing.  Calm demeanor.    Assessment and Plan: ***  Signed Lamar Blinks, MD

## 2018-06-16 ENCOUNTER — Ambulatory Visit: Payer: Managed Care, Other (non HMO) | Admitting: Family Medicine

## 2018-07-19 ENCOUNTER — Ambulatory Visit: Payer: Self-pay

## 2018-07-19 NOTE — Progress Notes (Addendum)
[name] [place], [name], [name] [address] [phone]  Date:  07/20/2018   Name:  Rachael Walker   DOB:  10-13-1960   MRN:  [id number]  PCP:  Darreld Mclean, MD    Chief Complaint: Edema (pt reports swelling jaw area. ) and Back Pain (Lower back.)   History of Present Illness:  Rachael Walker is a 58 y.o. very pleasant female patient who presents with the following:  Generally healthy woman here today with concern of her jaw swelling Right after Christmas she noted a rash on her face; it was very dry and swelled, mostly around her mouth and on the jawline On NYE she went to be seen at an UC- they gave her an oral steroid and was told it might be an allergic reaction The rash resolved but she has continued to have some swelling in her face-however this has not been that noticeable.  Patient feels that she sees it most in the morning, but is not sure if this is just because the mornings when she tends look in the mirror.  Her daughter feels that there is some swelling around her jawline, but her husband is not sure if there is any difference She shows me some photos of herself over the past 3 weeks, but I cannot see any definite abnormality  She otherwise feels well, no difficulty swallowing, no tooth pain, no fever or chills She may have some trace lower extremity edema as well  Patient Active Problem List   Diagnosis Date Noted  . Posterior pain of right hip 12/10/2016  . BUNION, RIGHT FOOT 04/23/2010  . ABNORMALITY OF GAIT 04/23/2010  . UNEQUAL LEG LENGTH 03/31/2010  . PLANTAR FASCIITIS, BILATERAL 03/13/2010  . STRESS FRACTURE, FOOT 03/13/2010    History reviewed. No pertinent past medical history.  Past Surgical History:  Procedure Laterality Date  . BUNIONECTOMY Right   . COLONOSCOPY  2009   w/[name]    Social History   Tobacco Use  . Smoking status: Never Smoker  . Smokeless tobacco:  Never Used  Substance Use Topics  . Alcohol use: Yes    Comment: occ. -2 times a month per pt  . Drug use: No    Family History  Problem Relation Age of Onset  . Osteoporosis Mother   . Colon polyps Mother   . Cancer Father   . Diabetes Father   . Hyperlipidemia Father   . Colon polyps Brother   . Cancer Maternal Grandmother        kidney  . Heart disease Maternal Grandfather   . Diabetes Paternal Grandmother   . Diabetes Brother   . Colon cancer Neg Hx     No Known Allergies  Medication list has been reviewed and updated.  Current Outpatient Medications on File Prior to Visit  Medication Sig Dispense Refill  . cyclobenzaprine (FLEXERIL) 10 MG tablet Take 1 tablet (10 mg total) by mouth 3 (three) times daily as needed for muscle spasms. (Patient not taking: Reported on 07/20/2018) 15 tablet 0  . Estradiol 10 MCG TABS vaginal tablet Place 1 tablet vaginally 1-3x a week as needed to prevent dryness (Patient not taking: Reported on 07/20/2018) 12 tablet 6  . naproxen (NAPROSYN) 500 MG tablet Take one pill 2 times daily with food for 7 days. Then take as needed. (Patient not taking: Reported on 07/20/2018) 40 tablet 0  . naproxen (NAPROSYN) 500  MG tablet Take 1 tablet (500 mg total) by mouth 2 (two) times daily as needed for mild pain, moderate pain or headache (TAKE WITH MEALS.). (Patient not taking: Reported on 07/20/2018) 20 tablet 0   No current facility-administered medications on file prior to visit.     Review of Systems:  As per HPI- otherwise negative. Wt Readings from Last 3 Encounters:  07/20/18 153 lb 12.8 oz (69.8 kg)  03/26/17 145 lb (65.8 kg)  03/15/17 154 lb 9.6 oz (70.1 kg)      Physical Examination: Vitals:   07/20/18 1440  BP: 122/82  Walker: 79  Temp: 98.6 F (37 C)  SpO2: 97%   Vitals:   07/20/18 1440  Weight: 153 lb 12.8 oz (69.8 kg)   Body mass index is 24.09 kg/m. Ideal Body Weight:    GEN: WDWN, NAD, Non-toxic, A & O x 3, normal weight,  looks well HEENT: Atraumatic, Normocephalic. Neck supple. No masses, No LAD. Bilateral TM wnl, oropharynx normal.  PEERL,EOMI. no tooth tenderness, no masses or other abnormality noted in her oral cavity, jaw, or neck. Ears and Nose: No external deformity. CV: RRR, No M/G/R. No JVD. No thrill. No extra heart sounds. PULM: CTA B, no wheezes, crackles, rhonchi. No retractions. No resp. distress. No accessory muscle use. ABD: S, NT, ND. No rebound. No HSM. EXTR: No c/c.  Trace if any lower extremity edema, within normal limits for a 58 year old woman in the afternoon NEURO Normal gait.  PSYCH: Normally interactive. Conversant. Not depressed or anxious appearing.  Calm demeanor.   Results for orders placed or performed in visit on 07/20/18  POCT urinalysis dipstick  Result Value Ref Range   Color, UA yellow yellow   Clarity, UA hazy (A) clear   Glucose, UA negative negative mg/dL   Bilirubin, UA negative negative   Ketones, POC UA negative negative mg/dL   Spec Grav, UA 1.015 1.010 - 1.025   Blood, UA negative negative   pH, UA 6.0 5.0 - 8.0   Protein Ur, POC negative negative mg/dL   Urobilinogen, UA 0.2 0.2 or 1.0 E.U./dL   Nitrite, UA Negative Negative   Leukocytes, UA Trace (A) Negative    Assessment and Plan: Facial swelling - Plan: Protein / creatinine ratio, urine, CBC, Comprehensive metabolic panel, POCT urinalysis dipstick  [name] had a rash on her face around Christmas time.  At [facility] she was seen in urgent care and given a steroid-prednisone-for this issue.  The rash has resolved but there is a question of some residual swelling around her jawline.  On exam I really do not appreciate any definite abnormality.  However, we will do some further work-up to look for any cause.  Will eval for possible nephrotic syndrome with labs as above.  I will be in touch with her pending these results  Signed Lamar Blinks, MD  I received her labs, letter to pt  Results for orders  placed or performed in visit on 07/20/18  Protein / creatinine ratio, urine  Result Value Ref Range   Creatinine, Urine 45 20 - 275 mg/dL   Protein/Creat Ratio 89 21 - 161 mg/g creat   Protein/Creatinine Ratio 0.089 0.021 - 0.16 mg/mg creat   Total Protein, Urine 4 (L) 5 - 24 mg/dL  CBC  Result Value Ref Range   WBC 5.0 4.0 - 10.5 K/uL   RBC 4.23 3.87 - 5.11 Mil/uL   Platelets 177.0 150.0 - 400.0 K/uL   Hemoglobin 12.7 12.0 -  15.0 g/dL   HCT 37.5 36.0 - 46.0 %   MCV 88.7 78.0 - 100.0 fl   MCHC 34.0 30.0 - 36.0 g/dL   RDW 13.9 11.5 - 15.5 %  Comprehensive metabolic panel  Result Value Ref Range   Sodium 139 135 - 145 mEq/L   Potassium 4.2 3.5 - 5.1 mEq/L   Chloride 102 96 - 112 mEq/L   CO2 32 19 - 32 mEq/L   Glucose, Bld 93 70 - 99 mg/dL   BUN 21 6 - 23 mg/dL   Creatinine, Ser 0.86 0.40 - 1.20 mg/dL   Total Bilirubin 0.5 0.2 - 1.2 mg/dL   Alkaline Phosphatase 106 39 - 117 U/L   AST 24 0 - 37 U/L   ALT 14 0 - 35 U/L   Total Protein 6.4 6.0 - 8.3 g/dL   Albumin 4.4 3.5 - 5.2 g/dL   Calcium 9.4 8.4 - 10.5 mg/dL   GFR 67.88 >60.00 mL/min  POCT urinalysis dipstick  Result Value Ref Range   Color, UA yellow yellow   Clarity, UA hazy (A) clear   Glucose, UA negative negative mg/dL   Bilirubin, UA negative negative   Ketones, POC UA negative negative mg/dL   Spec Grav, UA 1.015 1.010 - 1.025   Blood, UA negative negative   pH, UA 6.0 5.0 - 8.0   Protein Ur, POC negative negative mg/dL   Urobilinogen, UA 0.2 0.2 or 1.0 E.U./dL   Nitrite, UA Negative Negative   Leukocytes, UA Trace (A) Negative   Letter to pt

## 2018-07-19 NOTE — Telephone Encounter (Signed)
Pt called to say that she has had facial swelling since 12/31.  She was seen at urgent care on 12/31 with a rash which looked like chapped skin dry pealing skin and swelling to both sides of her face. The doctor there gave her an antibiotic.  Since taking the antibiotic the rash has gone and the swelling has improved.  Today the swelling is just at her jaw line. She describes it as mild but would like it evaluated. Appointment scheduled per protocol.  Care advice read to patient. Pt verbalized understanding of all instructions.   Reason for Disposition . [1] Mild facial swelling (puffiness) AND [2] persists > 3 days  Answer Assessment - Initial Assessment Questions 1. ONSET: "When did the swelling start?" (e.g., minutes, hours, days)     Dec 31 at jaw line both sides 2. LOCATION: "What part of the face is swollen?"     Jaw line 3. SEVERITY: "How swollen is it?"     mild 4. ITCHING: "Is there any itching?" If so, ask: "How much?"   (Scale 1-10; mild, moderate or severe)     none 5. PAIN: "Is the swelling painful to touch?" If so, ask: "How painful is it?"   (Scale 1-10; mild, moderate or severe)     no 6. FEVER: "Do you have a fever?" If so, ask: "What is it, how was it measured, and when did it start?"      no 7. CAUSE: "What do you think is causing the face swelling?"    Unsure alergic reaction contact dermatitis DX at urgent care 8. RECURRENT SYMPTOM: "Have you had face swelling before?" If so, ask: "When was the last time?" "What happened that time?"    no 9. OTHER SYMPTOMS: "Do you have any other symptoms?" (e.g., toothache, leg swelling)     no 10. PREGNANCY: "Is there any chance you are pregnant?" "When was your last menstrual period?"       No  Protocols used: [facility]

## 2018-07-20 ENCOUNTER — Encounter: Payer: Self-pay | Admitting: Family Medicine

## 2018-07-20 ENCOUNTER — Ambulatory Visit (INDEPENDENT_AMBULATORY_CARE_PROVIDER_SITE_OTHER): Payer: Managed Care, Other (non HMO) | Admitting: Family Medicine

## 2018-07-20 VITALS — BP 122/82 | HR 79 | Temp 98.6°F | Wt 153.8 lb

## 2018-07-20 DIAGNOSIS — R220 Localized swelling, mass and lump, head: Secondary | ICD-10-CM

## 2018-07-20 LAB — POCT URINALYSIS DIP (MANUAL ENTRY)
BILIRUBIN UA: NEGATIVE mg/dL
Bilirubin, UA: NEGATIVE
Blood, UA: NEGATIVE
Glucose, UA: NEGATIVE mg/dL
Nitrite, UA: NEGATIVE
PH UA: 6 (ref 5.0–8.0)
PROTEIN UA: NEGATIVE mg/dL
SPEC GRAV UA: 1.015 (ref 1.010–1.025)
UROBILINOGEN UA: 0.2 U/dL

## 2018-07-20 NOTE — Patient Instructions (Signed)
We are going to make sure that your kidneys are not leaking protein into your urine- this can cause swelling.  I will be in touch with your labs asap

## 2018-07-21 LAB — COMPREHENSIVE METABOLIC PANEL
ALBUMIN: 4.4 g/dL (ref 3.5–5.2)
ALT: 14 U/L (ref 0–35)
AST: 24 U/L (ref 0–37)
Alkaline Phosphatase: 106 U/L (ref 39–117)
BUN: 21 mg/dL (ref 6–23)
CALCIUM: 9.4 mg/dL (ref 8.4–10.5)
CHLORIDE: 102 meq/L (ref 96–112)
CO2: 32 meq/L (ref 19–32)
CREATININE: 0.86 mg/dL (ref 0.40–1.20)
GFR: 67.88 mL/min (ref >60.00)
Glucose, Bld: 93 mg/dL (ref 70–99)
POTASSIUM: 4.2 meq/L (ref 3.5–5.1)
SODIUM: 139 meq/L (ref 135–145)
Total Bilirubin: 0.5 mg/dL (ref 0.2–1.2)
Total Protein: 6.4 g/dL (ref 6.0–8.3)

## 2018-07-21 LAB — CBC
HEMATOCRIT: 37.5 % (ref 36.0–46.0)
Hemoglobin: 12.7 g/dL (ref 12.0–15.0)
MCHC: 34 g/dL (ref 30.0–36.0)
MCV: 88.7 fl (ref 78.0–100.0)
Platelets: 177 10*3/uL (ref 150.0–400.0)
RBC: 4.23 Mil/uL (ref 3.87–5.11)
RDW: 13.9 % (ref 11.5–15.5)
WBC: 5 10*3/uL (ref 4.0–10.5)

## 2018-07-21 LAB — PROTEIN / CREATININE RATIO, URINE
Creatinine, Urine: 45 mg/dL (ref 20–275)
Protein/Creat Ratio: 89 mg/g creat (ref 21–161)
Protein/Creatinine Ratio: 0.089 mg/mg creat (ref 0.021–0.16)
Total Protein, Urine: 4 mg/dL — ABNORMAL LOW (ref 5–24)

## 2018-09-19 ENCOUNTER — Ambulatory Visit: Payer: Managed Care, Other (non HMO) | Admitting: Sports Medicine

## 2019-06-07 ENCOUNTER — Ambulatory Visit: Payer: Managed Care, Other (non HMO) | Attending: Internal Medicine

## 2019-06-07 DIAGNOSIS — Z20822 Contact with and (suspected) exposure to covid-19: Secondary | ICD-10-CM

## 2019-06-08 LAB — NOVEL CORONAVIRUS, NAA: SARS-CoV-2, NAA: NOT DETECTED

## 2019-11-21 ENCOUNTER — Encounter: Payer: Self-pay | Admitting: Sports Medicine

## 2019-11-21 ENCOUNTER — Ambulatory Visit (INDEPENDENT_AMBULATORY_CARE_PROVIDER_SITE_OTHER): Payer: Managed Care, Other (non HMO) | Admitting: Sports Medicine

## 2019-11-21 ENCOUNTER — Other Ambulatory Visit: Payer: Self-pay

## 2019-11-21 ENCOUNTER — Ambulatory Visit: Payer: Self-pay

## 2019-11-21 VITALS — BP 122/84 | Ht 67.0 in | Wt 150.0 lb

## 2019-11-21 DIAGNOSIS — M79661 Pain in right lower leg: Secondary | ICD-10-CM | POA: Diagnosis not present

## 2019-11-21 MED ORDER — NITROGLYCERIN 0.2 MG/HR TD PT24: MEDICATED_PATCH | TRANSDERMAL | 1 refills | Status: DC

## 2019-11-21 NOTE — Progress Notes (Signed)
   [facility] [address] [place], [place] [address] Phone: [phone] Fax: [phone]   Patient Name: Rachael Walker Date of Birth: [birth year] Medical Record Number: [id number] Gender: female Date of Encounter: 11/21/2019  SUBJECTIVE:      Chief Complaint:  Right calf pain   HPI:  Patient is a 59 year old female presenting with 3 days of right calf pain.  She was doing cardio at a Pilates class Saturday morning, and during a jumping exercise experienced a pop with immediate pain and swelling of the right calf.  Since that time she has been walking with a limp and has pain with running, walking, or elliptical.  She had a calf sleeve at home that she has been wearing that has helped.  She denies any bruising or bleeding.  She denies any numbness or tingling into her toes.  He has not injured this calf in the past.     ROS:     See HPI.   PERTINENT  PMH / PSH / FH / SH:  Past Medical, Surgical, Social, and Family History Reviewed & Updated in the EMR. Pertinent findings include:  Unequal leg length, bilateral plantar fasciitis, bunionectomy of right foot   OBJECTIVE:  BP 122/84   Ht 5\' 7"  (1.702 m)   Wt 150 lb (68 kg)   LMP 02/24/2015 Comment: per pt doesn't last long, it's was for 1 day  BMI 23.49 kg/m  Physical Exam:  Vital signs are reviewed.   GEN: Alert and oriented, NAD Pulm: Breathing unlabored PSY: normal mood, congruent affect  MSK: Right calf Swelling at midportion of medial calf without hematoma or bruising TTP at midportion of medial calf Negative Thompson's test Pain with resisted plantarflexion with knee flexed Sensation is intact Walking with a abnormal gait and limp  Limited MSK ultrasound Right calf The right gastrocnemius was visualized in long and short axis with disruption of fibers at the medial portion of the medial calf visualized in both long and short axis.  No increased blood flow to disruption of fibers.   There was mild hypoechoic changes at the musculotendinous junction of the Achilles tendon and gastrocnemius.  Impression: Partial tear of right medial mid gastrocnemius muscle  Ultrasound was performed and interpreted by Dr. Kathrynn Speed and Wolfgang Phoenix.  Fields, MD  ASSESSMENT & PLAN:   1. Right gastrocnemius tear  We placed a 5/16 inch heel lift into the right shoe that patient noticed immediate improvement in symptoms from.  She is to continue using her compression sleeve.  She will start the [place] home exercises.  We have also given her a prescription for nitroglycerin patches.  We will plan to see her back in 6 weeks at which time we will rescan to look for improvement in fibers.   Lanier Clam, DO, ATC Sports Medicine Fellow  I observed and examined the patient with Dr. Kathrynn Speed and agree with assessment and plan.  Note reviewed and modified by me. Ila Mcgill, MD

## 2019-11-21 NOTE — Patient Instructions (Signed)

## 2020-01-02 ENCOUNTER — Other Ambulatory Visit: Payer: Self-pay

## 2020-01-02 ENCOUNTER — Ambulatory Visit (INDEPENDENT_AMBULATORY_CARE_PROVIDER_SITE_OTHER): Payer: Managed Care, Other (non HMO) | Admitting: Sports Medicine

## 2020-01-02 DIAGNOSIS — S86112D Strain of other muscle(s) and tendon(s) of posterior muscle group at lower leg level, left leg, subsequent encounter: Secondary | ICD-10-CM | POA: Diagnosis not present

## 2020-01-02 NOTE — Assessment & Plan Note (Signed)
Improving and doing well without pain. Given she passed her functional screening test of her right calf she is now cleared to begin running. - Use heel lifts on the right - Cont use of nitro patches - cont exercises given at last appointment - Start running for 15 min per day no more than 3 days per week. If no pain increase by 5 min per day each week until 30 minutes of pain free running - F/u in 6 weeks

## 2020-01-02 NOTE — Progress Notes (Signed)
    SUBJECTIVE:   CHIEF COMPLAINT / HPI:   Follow up for Right Medical Gastrocnemius tear Rachael Walker is a 59 y/o female presenting for her 6 week follow up due to a tear of her medial gastrocnemius muscle at the origin down to the mid belly of the muscle. She sustained this injury while doing a step back in Pilates.  Too painful to run. She states she feels much better, has no pain, and feels strong. She is NOT running but has been doing some light walking without pain or irritation. She has also been doing her exercises and using the nitro patch which seems to be helping out.  She is able to perform the functional test of her right calf and passed.  PERTINENT  PMH / PSH: none  OBJECTIVE:   BP 110/76   Ht 5\' 7"  (1.702 m)   Wt 150 lb (68 kg)   LMP 02/24/2015 Comment: per pt doesn't last long, it's was for 1 day  BMI 23.49 kg/m   MSK Lower Extremity, Right: No obvious bony deformity, rash, erythema, or ecchymosis. Not tender to palpation at the insertion or along the muscle belly of the gastrocnemius muscle on the medial or lateral sides. FROM in the knee without pain, 5/5 strength, gross sensation intact. Walks without a limp.  Limited US Lower Extremity, Right: The right gastrocnemius muscle is well visualized in long and short axis with remaining deep fibers of the medial portion of the medial calf. This is improved from the previous exam.  No evidence of neovascularization today.  Still some minor hypoechoic changes at the musculotendinous junction at the achilles tendon and gastrocnemius. Note distal muscle fibers still disordered in appearance  Impression: Healing tear of the medial gastrocnemius right leg  Ultrasound and interpretation by Dr. Garlan Fillers and Wolfgang Phoenix. Fields, MD   ASSESSMENT/PLAN:   Rupture of medial head of gastrocnemius, left, subsequent encounter Improving and doing well without pain. Given she passed her functional screening test of her right calf she is now  cleared to begin running. - Use heel lifts on the right - Cont use of nitro patches - cont exercises given at last appointment - Start running for 15 min per day no more than 3 days per week. If no pain increase by 5 min per day each week until 30 minutes of pain free running - F/u in 6 weeks  Repeat US on [place], [place]  I observed and examined the patient with the fellowt and agree with assessment and plan.  Note reviewed and modified by me. Ila Mcgill, MD

## 2020-01-02 NOTE — Patient Instructions (Signed)
We are happy to hear you are improving!  -Keep up with the calf exercises -Continue using the nitro patches -We gave you more heel lifts today. Please put these in all of your shoes. -We'll see you back in 6 weeks.

## 2020-02-13 ENCOUNTER — Ambulatory Visit: Payer: Managed Care, Other (non HMO) | Admitting: Sports Medicine

## 2020-05-14 ENCOUNTER — Other Ambulatory Visit: Payer: Self-pay | Admitting: Sports Medicine

## 2020-09-24 NOTE — Patient Instructions (Addendum)
Good to see you again today   I will be in touch with your labs ASAP.  You have an appointment to have a CT scan tomorrow at the med center [facility] at 10 AM-  Please pick up oral contrast at the imaging department here at the med center [facility] before you leave today, please drink 1 bottle 2 hours prior to your scan, the second bottle 1 hour prior to her scan  [facility] Address: [address], [place], [place] [address] The urgent care is open tomorrow, but the main building may be closed due to holiday.  You can go into the urgent care and ask if you need to.  Imaging is on the lower level-the phone number there in case you have a problem is [phone]  If you develop any more severe bleeding, or bleeding between bowel movements, or if you are feeling weak or dizzy please go to the emergency department

## 2020-09-24 NOTE — Progress Notes (Addendum)
[facility] [place], [place], [place] [address] [phone]  Date:  09/26/2020   Name:  Rachael Walker   DOB:  12/02/1960   MRN:  [id number]  PCP:  Darreld Mclean, MD    Chief Complaint: Cough (Cough, congestion, hoarseness, headache, fatigue, fever) and Blood In Stools (2/23 saw obgyn)   History of Present Illness:  Rachael Walker is a 60 y.o. very pleasant female patient who presents with the following:  Here today for a follow-up visit  Last seen by myself about 2 years ago   Hep C and HIV screening Labs- update toay Pap- per GYN mammo covid series - done  She notes that about a month ago she first noted dark red blood in her stool, mixed into the stool She called her GYN, Dr Milta Deiters She saw him the week after this began- he did not find anything amixx except for perhaps hemorrhoids.  He did a pelvic ultrasound and all was normal, she also had a D&C and I believe endometrial biopsy last year She has continued to have some blood in her stool on nearly a daily basis, also started having some diarrhea and mucus in her stool over the last couple of weeks She may have some grumbling in her belly  A week ago today she also got ill with an apparent secondary illness-she noted HA, fatigue, cough, fever up to about 101 She tested twice for covid and was negative.   She has used some flonase, etc Her fevers have since resolved, and overall these particular sx are now gone   No nausea or pain, perhaps a bit of GI cramping Not vomiting She is eating ok since she recovered from her recent URI   Colonoscopy done in 2018- hemorrhoids but OW normal   Patient Active Problem List   Diagnosis Date Noted  . Rupture of medial head of gastrocnemius, left, subsequent encounter 01/02/2020  . Posterior pain of right hip 12/10/2016  . BUNION, RIGHT FOOT 04/23/2010  . ABNORMALITY OF GAIT 04/23/2010  . UNEQUAL LEG LENGTH  03/31/2010  . PLANTAR FASCIITIS, BILATERAL 03/13/2010  . STRESS FRACTURE, FOOT 03/13/2010    History reviewed. No pertinent past medical history.  Past Surgical History:  Procedure Laterality Date  . BUNIONECTOMY Right   . COLONOSCOPY  2009   w/[name]    Social History   Tobacco Use  . Smoking status: Never Smoker  . Smokeless tobacco: Never Used  Vaping Use  . Vaping Use: Never used  Substance Use Topics  . Alcohol use: Yes    Comment: occ. -2 times a month per pt  . Drug use: No    Family History  Problem Relation Age of Onset  . Osteoporosis Mother   . Colon polyps Mother   . Cancer Father   . Diabetes Father   . Hyperlipidemia Father   . Colon polyps Brother   . Cancer Maternal Grandmother        kidney  . Heart disease Maternal Grandfather   . Diabetes Paternal Grandmother   . Diabetes Brother   . Colon cancer Neg Hx     No Known Allergies  Medication list has been reviewed and updated.  Current Outpatient Medications on File Prior to Visit  Medication Sig Dispense Refill  . estradiol (ESTRACE) 2 MG tablet estradiol 2 mg tablet  TAKE 1 TABLET BY MOUTH EVERY DAY    . progesterone (  PROMETRIUM) 100 MG capsule progesterone micronized 100 mg capsule     No current facility-administered medications on file prior to visit.    Review of Systems:  As per HPI- otherwise negative.   Physical Examination: Vitals:   09/26/20 1615  BP: 118/80  Pulse: 66  Resp: 16  Temp: 98 F (36.7 C)  SpO2: 99%   Vitals:   09/26/20 1615  Weight: 150 lb (68 kg)  Height: 5\' 7"  (1.702 m)   Body mass index is 23.49 kg/m. Ideal Body Weight: Weight in (lb) to have BMI = 25: 159.3  GEN: no acute distress.  Looks well, athletic build HEENT: Atraumatic, Normocephalic.   Bilateral TM wnl, oropharynx normal.  PEERL,EOMI.   Ears and Nose: No external deformity. CV: RRR, No M/G/R. No JVD. No thrill. No extra heart sounds. PULM: CTA B, no wheezes, crackles, rhonchi. No  retractions. No resp. distress. No accessory muscle use. ABD: S, NT, ND, +BS. No rebound. No HSM. EXTR: No c/c/e PSYCH: Normally interactive. Conversant.  Rectal exam-no gross blood  Assessment and Plan: Screening for deficiency anemia - Plan: CBC  Blood in stool - Plan: CBC, CT Abdomen Pelvis W Contrast  Screening for diabetes mellitus - Plan: Comprehensive metabolic panel, Hemoglobin A1c  Screening for thyroid disorder - Plan: TSH  Fatigue, unspecified type - Plan: TSH, VITAMIN D 25 Hydroxy (Vit-D Deficiency, Fractures)  Encounter for hepatitis C screening test for low risk patient - Plan: Hepatitis C antibody  Screening, lipid - Plan: Lipid panel  Patient today for follow-up visit, also she has noted blood in her stool for about a month.  Concern for possible diverticular bleed We tried to arrange a CT scan for tomorrow, unfortunately we are not able to get this authorized through her insurance company as it is too late in the day.  Patient is aware that her CT cannot be done outpatient until next week.  I did offer her the option of going to the emergency room if she would like to have a CT scan done more urgently  For the time being we will plan to check her CBC to ensure no significant anemia.  If this is stable and her bleeding does not increase we will plan to do her CT the first of next week.  She is advised to go directly to the emergency room if she has any more brisk bleeding, pain, or if she feels faint or lightheaded. She states understanding and agreement with our plan  Declines HCG today due to her age  This visit occurred during the SARS-CoV-2 public health emergency.  Safety protocols were in place, including screening questions prior to the visit, additional usage of staff PPE, and extensive cleaning of exam room while observing appropriate contact time as indicated for disinfecting solutions.    Signed Lamar Blinks, MD  Addendum 4/15, received her labs as  follows-message to patient  Results for orders placed or performed in visit on 09/26/20  CBC  Result Value Ref Range   WBC 4.2 3.8 - 10.8 Thousand/uL   RBC 4.10 3.80 - 5.10 Million/uL   Hemoglobin 12.1 11.7 - 15.5 g/dL   HCT 36.9 35.0 - 45.0 %   MCV 90.0 80.0 - 100.0 fL   MCH 29.5 27.0 - 33.0 pg   MCHC 32.8 32.0 - 36.0 g/dL   RDW 12.4 11.0 - 15.0 %   Platelets 204 140 - 400 Thousand/uL   MPV 10.9 7.5 - 12.5 fL  Comprehensive metabolic panel  Result Value Ref Range   Glucose, Bld 104 (H) 65 - 99 mg/dL   BUN 11 7 - 25 mg/dL   Creat 0.85 0.50 - 1.05 mg/dL   BUN/Creatinine Ratio NOT APPLICABLE 6 - 22 (calc)   Sodium 140 135 - 146 mmol/L   Potassium 3.8 3.5 - 5.3 mmol/L   Chloride 103 98 - 110 mmol/L   CO2 28 20 - 32 mmol/L   Calcium 8.6 8.6 - 10.4 mg/dL   Total Protein 6.3 6.1 - 8.1 g/dL   Albumin 3.7 3.6 - 5.1 g/dL   Globulin 2.6 1.9 - 3.7 g/dL (calc)   AG Ratio 1.4 1.0 - 2.5 (calc)   Total Bilirubin 0.3 0.2 - 1.2 mg/dL   Alkaline phosphatase (APISO) 72 37 - 153 U/L   AST 25 10 - 35 U/L   ALT 15 6 - 29 U/L  Hemoglobin A1c  Result Value Ref Range   Hgb A1c MFr Bld 5.7 (H) <5.7 % of total Hgb   Mean Plasma Glucose 117 mg/dL   eAG (mmol/L) 6.5 mmol/L  Lipid panel  Result Value Ref Range   Cholesterol 179 <200 mg/dL   HDL 66 > OR = 50 mg/dL   Triglycerides 85 <150 mg/dL   LDL Cholesterol (Calc) 95 mg/dL (calc)   Total CHOL/HDL Ratio 2.7 <5.0 (calc)   Non-HDL Cholesterol (Calc) 113 <130 mg/dL (calc)  TSH  Result Value Ref Range   TSH 2.90 0.40 - 4.50 mIU/L  VITAMIN D 25 Hydroxy (Vit-D Deficiency, Fractures)  Result Value Ref Range   Vit D, 25-Hydroxy 26 (L) 30 - 100 ng/mL

## 2020-09-26 ENCOUNTER — Ambulatory Visit (INDEPENDENT_AMBULATORY_CARE_PROVIDER_SITE_OTHER): Payer: Managed Care, Other (non HMO) | Admitting: Family Medicine

## 2020-09-26 ENCOUNTER — Encounter: Payer: Self-pay | Admitting: Family Medicine

## 2020-09-26 ENCOUNTER — Other Ambulatory Visit: Payer: Self-pay

## 2020-09-26 VITALS — BP 118/80 | HR 66 | Temp 98.0°F | Resp 16 | Ht 67.0 in | Wt 150.0 lb

## 2020-09-26 DIAGNOSIS — K921 Melena: Secondary | ICD-10-CM

## 2020-09-26 DIAGNOSIS — Z130 Encounter for screening for diseases of the blood and blood-forming organs and certain disorders involving the immune mechanism: Secondary | ICD-10-CM

## 2020-09-26 DIAGNOSIS — Z13220 Encounter for screening for lipoid disorders: Secondary | ICD-10-CM

## 2020-09-26 DIAGNOSIS — Z1159 Encounter for screening for other viral diseases: Secondary | ICD-10-CM

## 2020-09-26 DIAGNOSIS — Z131 Encounter for screening for diabetes mellitus: Secondary | ICD-10-CM

## 2020-09-26 DIAGNOSIS — Z1329 Encounter for screening for other suspected endocrine disorder: Secondary | ICD-10-CM | POA: Diagnosis not present

## 2020-09-26 DIAGNOSIS — R5383 Other fatigue: Secondary | ICD-10-CM

## 2020-09-26 LAB — CBC: RDW: 12.4 % (ref 11.0–15.0)

## 2020-09-27 ENCOUNTER — Other Ambulatory Visit: Payer: Managed Care, Other (non HMO)

## 2020-09-27 ENCOUNTER — Encounter: Payer: Self-pay | Admitting: Family Medicine

## 2020-09-27 LAB — COMPREHENSIVE METABOLIC PANEL
AG Ratio: 1.4 (calc) (ref 1.0–2.5)
ALT: 15 U/L (ref 6–29)
AST: 25 U/L (ref 10–35)
Albumin: 3.7 g/dL (ref 3.6–5.1)
Alkaline phosphatase (APISO): 72 U/L (ref 37–153)
BUN: 11 mg/dL (ref 7–25)
CO2: 28 mmol/L (ref 20–32)
Calcium: 8.6 mg/dL (ref 8.6–10.4)
Chloride: 103 mmol/L (ref 98–110)
Creat: 0.85 mg/dL (ref 0.50–1.05)
Globulin: 2.6 g/dL (calc) (ref 1.9–3.7)
Glucose, Bld: 104 mg/dL — ABNORMAL HIGH (ref 65–99)
Potassium: 3.8 mmol/L (ref 3.5–5.3)
Sodium: 140 mmol/L (ref 135–146)
Total Bilirubin: 0.3 mg/dL (ref 0.2–1.2)
Total Protein: 6.3 g/dL (ref 6.1–8.1)

## 2020-09-27 LAB — CBC
HCT: 36.9 % (ref 35.0–45.0)
Hemoglobin: 12.1 g/dL (ref 11.7–15.5)
MCH: 29.5 pg (ref 27.0–33.0)
MCHC: 32.8 g/dL (ref 32.0–36.0)
MCV: 90 fL (ref 80.0–100.0)
MPV: 10.9 fL (ref 7.5–12.5)
Platelets: 204 10*3/uL (ref 140–400)
RBC: 4.1 10*6/uL (ref 3.80–5.10)
WBC: 4.2 10*3/uL (ref 3.8–10.8)

## 2020-09-27 LAB — LIPID PANEL
Cholesterol: 179 mg/dL (ref <200)
HDL: 66 mg/dL (ref >=50)
LDL Cholesterol (Calc): 95 mg/dL (calc)
Non-HDL Cholesterol (Calc): 113 mg/dL (calc) (ref <130)
Total CHOL/HDL Ratio: 2.7 (calc) (ref <5.0)
Triglycerides: 85 mg/dL (ref <150)

## 2020-09-27 LAB — TSH: TSH: 2.9 mIU/L (ref 0.40–4.50)

## 2020-09-27 LAB — VITAMIN D 25 HYDROXY (VIT D DEFICIENCY, FRACTURES): Vit D, 25-Hydroxy: 26 ng/mL — ABNORMAL LOW (ref 30–100)

## 2020-09-27 LAB — HEPATITIS C ANTIBODY
Hepatitis C Ab: NONREACTIVE
SIGNAL TO CUT-OFF: 0.01 (ref <1.00)

## 2020-09-27 LAB — HEMOGLOBIN A1C
Hgb A1c MFr Bld: 5.7 % of total Hgb — ABNORMAL HIGH (ref <5.7)
Mean Plasma Glucose: 117 mg/dL
eAG (mmol/L): 6.5 mmol/L

## 2020-09-30 ENCOUNTER — Encounter: Payer: Self-pay | Admitting: Family Medicine

## 2020-09-30 ENCOUNTER — Telehealth: Payer: Self-pay | Admitting: Family Medicine

## 2020-09-30 NOTE — Telephone Encounter (Signed)
-----   Message from Darreld Mclean, MD sent at 09/29/2020  8:54 PM EDT ----- Arrange her CT scan

## 2020-10-01 ENCOUNTER — Encounter: Payer: Self-pay | Admitting: Family Medicine

## 2020-10-01 ENCOUNTER — Ambulatory Visit (HOSPITAL_BASED_OUTPATIENT_CLINIC_OR_DEPARTMENT_OTHER)
Admission: RE | Admit: 2020-10-01 | Discharge: 2020-10-01 | Disposition: A | Discharge status: Home / Self Care | Payer: Managed Care, Other (non HMO) | Source: Ambulatory Visit | Attending: Family Medicine | Admitting: Family Medicine

## 2020-10-01 ENCOUNTER — Encounter: Payer: Self-pay | Admitting: Gastroenterology

## 2020-10-01 ENCOUNTER — Other Ambulatory Visit: Payer: Self-pay

## 2020-10-01 DIAGNOSIS — K921 Melena: Secondary | ICD-10-CM

## 2020-10-01 MED ORDER — IOHEXOL 300 MG/ML  SOLN
100.0000 mL | Freq: Once | INTRAMUSCULAR | Status: AC | PRN
  Administered 2020-10-01: 100 mL via INTRAVENOUS

## 2020-10-14 ENCOUNTER — Encounter: Payer: Self-pay | Admitting: Nurse Practitioner

## 2020-10-14 ENCOUNTER — Ambulatory Visit (INDEPENDENT_AMBULATORY_CARE_PROVIDER_SITE_OTHER): Payer: Managed Care, Other (non HMO) | Admitting: Nurse Practitioner

## 2020-10-14 VITALS — BP 120/62 | HR 68 | Ht 67.0 in | Wt 153.0 lb

## 2020-10-14 DIAGNOSIS — K625 Hemorrhage of anus and rectum: Secondary | ICD-10-CM | POA: Diagnosis not present

## 2020-10-14 DIAGNOSIS — R194 Change in bowel habit: Secondary | ICD-10-CM

## 2020-10-14 MED ORDER — CLENPIQ 10-3.5-12 MG-GM -GM/160ML PO SOLN: 320.0000 mL | Freq: Once | ORAL | 0 refills | Status: AC

## 2020-10-14 NOTE — Patient Instructions (Signed)
If you are age 60 or older, your body mass index should be between 23-30. Your Body mass index is 23.96 kg/m. If this is out of the aforementioned range listed, please consider follow up with your Primary Care Provider.  If you are age 60 or younger, your body mass index should be between 19-25. Your Body mass index is 23.96 kg/m. If this is out of the aformentioned range listed, please consider follow up with your Primary Care Provider.   You have been scheduled for a colonoscopy. Please follow written instructions given to you at your visit today.  Please pick up your prep supplies at the pharmacy within the next 1-3 days. If you use inhalers (even only as needed), please bring them with you on the day of your procedure.  Due to recent changes in healthcare laws, you may see the results of your imaging and laboratory studies on MyChart before your provider has had a chance to review them.  We understand that in some cases there may be results that are confusing or concerning to you. Not all laboratory results come back in the same time frame and the provider may be waiting for multiple results in order to interpret others.  Please give [place] 48 hours in order for your provider to thoroughly review all the results before contacting the office for clarification of your results.   Thank you for choosing me and [facility].  Tye Savoy, NP

## 2020-10-14 NOTE — Progress Notes (Signed)
ASSESSMENT AND PLAN    # 60 year old female with a 6 week history of LLQ tenderness, changes in  stool consistency and presence of blood and mucous in bowel movements. Labs and CT scan unrevealing. Not having flagrant diarrhea to suggest infectious process.  -- For further evaluation patient will be scheduled for colonoscopy. The risks and benefits of colonoscopy with possible polypectomy / biopsies were discussed and the patient agrees to proceed.    HISTORY OF PRESENT ILLNESS     Chief Complaint : bowel changes, blood in stool  Rachael Walker is a 60 y.o. female with no significant PMH.  She is known to Dr. Silverio Decamp from screening colonoscopy in September 2018  Patient has been having blood in her stools since mid March. Initially she had blood in urine as well. She saw her GYN and says a transvaginal US was normal.  The episodes of rectal bleeding have become more frequent and she has also started seeing mucus in her stool.  A couple of weeks ago the consistency of her stools changed .  Sometimes she has solid ribbonlike BMs, other times stools are loose and urgent.  She has developed problems with abdominal bloating . No recent dietary changes. No medication changes ( only takes replacement hormones).  She saw her PCP who ordered labs and CT scan.  CBC and CMP were unremarkable. CT scan  suggestive of mild constipation.  Her weight has been stable. No [place] of IBD.     PREVIOUS EVALUATIONS:   September 2018 screening colonoscopy -- Complete exam, adequate prep -- 4 mm rectal polyp --Nonbleeding internal hemorrhoids  Polyp was hyperplastic   Past Surgical History:  Procedure Laterality Date  . BUNIONECTOMY Right   . COLONOSCOPY  2009   w/[name]   Family History  Problem Relation Age of Onset  . Osteoporosis Mother   . Colon polyps Mother   . Cancer Father   . Diabetes Father   . Hyperlipidemia Father   . Colon polyps Brother   . Cancer Maternal Grandmother         kidney  . Heart disease Maternal Grandfather   . Diabetes Paternal Grandmother   . Diabetes Brother   . Colon cancer Neg Hx    Social History   Tobacco Use  . Smoking status: Never Smoker  . Smokeless tobacco: Never Used  Vaping Use  . Vaping Use: Never used  Substance Use Topics  . Alcohol use: Yes    Comment: occ. -2 times a month per pt  . Drug use: No   Current Outpatient Medications  Medication Sig Dispense Refill  . estradiol (ESTRACE) 2 MG tablet estradiol 2 mg tablet  TAKE 1 TABLET BY MOUTH EVERY DAY    . progesterone (PROMETRIUM) 100 MG capsule progesterone micronized 100 mg capsule     No current facility-administered medications for this visit.   No Known Allergies   Review of Systems:  All systems reviewed and negative except where noted in HPI.    PHYSICAL EXAM :    Wt Readings from Last 3 Encounters:  10/14/20 153 lb (69.4 kg)  09/26/20 150 lb (68 kg)  01/02/20 150 lb (68 kg)    BP 120/62   Pulse 68   Ht 5\' 7"  (1.702 m)   Wt 153 lb (69.4 kg)   LMP 02/24/2015 Comment: per pt doesn't last long, it's was for 1 day  BMI 23.96 kg/m  Constitutional:  Pleasant female in no acute distress.  Psychiatric: Normal mood and affect. Behavior is normal. EENT: Pupils normal.  Conjunctivae are normal. No scleral icterus. Neck supple.  Cardiovascular: Normal rate, regular rhythm. No edema Pulmonary/chest: Effort normal and breath sounds normal. No wheezing, rales or rhonchi. Abdominal: Soft, nondistended, mid LLQ tenderness. Bowel sounds active throughout. There are no masses palpable. No hepatomegaly. Neurological: Alert and oriented to person place and time. Skin: Skin is warm and dry. No rashes noted.  Tye Savoy, NP  10/14/2020, 3:12 PM  Cc:  Copland, Gay Filler, MD

## 2020-10-24 ENCOUNTER — Encounter: Payer: Self-pay | Admitting: Gastroenterology

## 2020-10-24 ENCOUNTER — Ambulatory Visit (AMBULATORY_SURGERY_CENTER): Payer: Managed Care, Other (non HMO) | Admitting: Gastroenterology

## 2020-10-24 ENCOUNTER — Other Ambulatory Visit: Payer: Self-pay

## 2020-10-24 VITALS — BP 109/73 | HR 47 | Temp 98.7°F | Resp 18 | Ht 67.0 in | Wt 153.0 lb

## 2020-10-24 DIAGNOSIS — K635 Polyp of colon: Secondary | ICD-10-CM

## 2020-10-24 DIAGNOSIS — K625 Hemorrhage of anus and rectum: Secondary | ICD-10-CM

## 2020-10-24 DIAGNOSIS — D122 Benign neoplasm of ascending colon: Secondary | ICD-10-CM | POA: Diagnosis not present

## 2020-10-24 DIAGNOSIS — R194 Change in bowel habit: Secondary | ICD-10-CM | POA: Diagnosis present

## 2020-10-24 DIAGNOSIS — K50111 Crohn's disease of large intestine with rectal bleeding: Secondary | ICD-10-CM | POA: Diagnosis not present

## 2020-10-24 DIAGNOSIS — K51511 Left sided colitis with rectal bleeding: Secondary | ICD-10-CM

## 2020-10-24 HISTORY — PX: COLONOSCOPY: SHX174

## 2020-10-24 MED ORDER — SODIUM CHLORIDE 0.9 % IV SOLN: 500.0000 mL | Freq: Once | INTRAVENOUS | Status: DC

## 2020-10-24 MED ORDER — BUDESONIDE 3 MG PO CPEP
9.0000 mg | ORAL_CAPSULE | Freq: Every day | ORAL | 0 refills | Status: DC
Start: 2020-10-24 — End: 2020-11-15

## 2020-10-24 NOTE — Progress Notes (Signed)
Pt's states no medical or surgical changes since previsit or office visit.  VS Robinson

## 2020-10-24 NOTE — Progress Notes (Signed)
Called to room to assist during endoscopic procedure.  Patient ID and intended procedure confirmed with present staff. Received instructions for my participation in the procedure from the performing physician.  

## 2020-10-24 NOTE — Patient Instructions (Addendum)
RESUME PREVIOUS DIET CONTINUE CURRENT MEDICATIONS AWAIT PATHOLOGY RESULTS DO NOT TAKE ASPIRIN, IBUPROFEN, NAPROXEN, OR ANY OTHER NONSTEROIDAL ANTIINFLAMMATORY DRUGS Start taking Budesonide 9mg  daily for one month Return to office in one month  YOU HAD AN ENDOSCOPIC PROCEDURE TODAY AT [facility]:   Refer to the procedure report that was given to you for any specific questions about what was found during the examination.  If the procedure report does not answer your questions, please call your gastroenterologist to clarify.  If you requested that your care partner not be given the details of your procedure findings, then the procedure report has been included in a sealed envelope for you to review at your convenience later.  YOU SHOULD EXPECT: Some feelings of bloating in the abdomen. Passage of more gas than usual.  Walking can help get rid of the air that was put into your GI tract during the procedure and reduce the bloating. If you had a lower endoscopy (such as a colonoscopy or flexible sigmoidoscopy) you may notice spotting of blood in your stool or on the toilet paper. If you underwent a bowel prep for your procedure, you may not have a normal bowel movement for a few days.  Please Note:  You might notice some irritation and congestion in your nose or some drainage.  This is from the oxygen used during your procedure.  There is no need for concern and it should clear up in a day or so.  SYMPTOMS TO REPORT IMMEDIATELY:   Following lower endoscopy (colonoscopy or flexible sigmoidoscopy):  Excessive amounts of blood in the stool  Significant tenderness or worsening of abdominal pains  Swelling of the abdomen that is new, acute  Fever of 100F or higher  For urgent or emergent issues, a gastroenterologist can be reached at any hour by calling [phone]. Do not use MyChart messaging for urgent concerns.   DIET:  We do recommend a small meal at first, but then you may  proceed to your regular diet.  Drink plenty of fluids but you should avoid alcoholic beverages for 24 hours.  ACTIVITY:  You should plan to take it easy for the rest of today and you should NOT DRIVE or use heavy machinery until tomorrow (because of the sedation medicines used during the test).    FOLLOW UP: Our staff will call the number listed on your records 48-72 hours following your procedure to check on you and address any questions or concerns that you may have regarding the information given to you following your procedure. If we do not reach you, we will leave a message.  We will attempt to reach you two times.  During this call, we will ask if you have developed any symptoms of COVID 19. If you develop any symptoms (ie: fever, flu-like symptoms, shortness of breath, cough etc.) before then, please call [phone].  If you test positive for Covid 19 in the 2 weeks post procedure, please call and report this information to [place].    If any biopsies were taken you will be contacted by phone or by letter within the next 1-3 weeks.  Please call us at [phone] if you have not heard about the biopsies in 3 weeks.   SIGNATURES/CONFIDENTIALITY: You and/or your care partner have signed paperwork which will be entered into your electronic medical record.  These signatures attest to the fact that that the information above on your After Visit Summary has been reviewed and is understood.  Full responsibility of the confidentiality of this discharge information lies with you and/or your care-partner.

## 2020-10-24 NOTE — Progress Notes (Signed)
PT taken to PACU. Monitors in place. VSS. Report given to RN. 

## 2020-10-24 NOTE — Op Note (Addendum)
[facility] Patient Name: Rachael Walker Procedure Date: 10/24/2020 10:22 AM MRN: [id number] Endoscopist: Mauri Pole , MD Age: 60 Referring MD:  Date of Birth: 01-26-1961 Gender: Female Account #: [id number] Procedure:                Colonoscopy Indications:              Evaluation of unexplained GI bleeding presenting                            with Hematochezia, Change in bowel habits Medicines:                Monitored Anesthesia Care Procedure:                Pre-Anesthesia Assessment:                           - Prior to the procedure, a History and Physical                            was performed, and patient medications and                            allergies were reviewed. The patient's tolerance of                            previous anesthesia was also reviewed. The risks                            and benefits of the procedure and the sedation                            options and risks were discussed with the patient.                            All questions were answered, and informed consent                            was obtained. Prior Anticoagulants: The patient has                            taken no previous anticoagulant or antiplatelet                            agents. ASA Grade Assessment: I - A normal, healthy                            patient. After reviewing the risks and benefits,                            the patient was deemed in satisfactory condition to                            undergo the procedure.  After obtaining informed consent, the colonoscope                            was passed under direct vision. Throughout the                            procedure, the patient's blood pressure, pulse, and                            oxygen saturations were monitored continuously. The                            Olympus PFC-H190DL [id number]) Colonoscope was                            introduced through the anus  and advanced to the the                            terminal ileum, with identification of the                            appendiceal orifice and IC valve. The colonoscopy                            was performed without difficulty. The patient                            tolerated the procedure well. The quality of the                            bowel preparation was inadequate. The ileocecal                            valve, appendiceal orifice, and rectum were                            photographed. Scope In: 10:29:18 AM Scope Out: 10:52:29 AM Scope Withdrawal Time: 0 hours 12 minutes 40 seconds  Total Procedure Duration: 0 hours 23 minutes 11 seconds  Findings:                 The perianal and digital rectal examinations were                            normal.                           A 5 mm polyp was found in the ascending colon. The                            polyp was sessile. The polyp was removed with a                            cold snare. Resection and retrieval were complete.  Patchy mild inflammation characterized by                            congestion (edema), erosions, erythema, friability,                            granularity, linear erosions and mucus was found in                            the rectum, in the sigmoid colon and in the                            descending colon. Biopsies were taken with a cold                            forceps for histology.                           The terminal ileum appeared normal.                           Non-bleeding external and internal hemorrhoids were                            found during retroflexion. The hemorrhoids were                            medium-sized. Complications:            No immediate complications. Estimated Blood Loss:     Estimated blood loss was minimal. Impression:               - Preparation of the colon was inadequate.                           - One 5 mm polyp in the  ascending colon, removed                            with a cold snare. Resected and retrieved.                           - Patchy mild inflammation was found in the rectum,                            in the sigmoid colon and in the descending colon                            secondary to colitis. Biopsied.                           - The examined portion of the ileum was normal.                           - Non-bleeding external and internal hemorrhoids. Recommendation:           - Patient has  a contact number available for                            emergencies. The signs and symptoms of potential                            delayed complications were discussed with the                            patient. Return to normal activities tomorrow.                            Written discharge instructions were provided to the                            patient.                           - Resume previous diet.                           - Continue present medications.                           - Await pathology results.                           - No aspirin, ibuprofen, naproxen, or other                            non-steroidal anti-inflammatory drugs.                           - Repeat colonoscopy date to be determined after                            pending pathology results are reviewed for                            surveillance based on pathology results.                           - Budesonide 9mg  daily X 30 days                           - Follow up in GI office in 3-4 weeks, please call                            to schedule appointment Mauri Pole, MD 10/24/2020 10:59:01 AM This report has been signed electronically.

## 2020-10-28 ENCOUNTER — Telehealth: Payer: Self-pay

## 2020-10-28 NOTE — Telephone Encounter (Signed)
Left message on answering machine. 

## 2020-11-01 NOTE — Progress Notes (Signed)
Reviewed and agree with documentation and assessment and plan. K. Veena Lakenzie Mcclafferty , MD   

## 2020-11-05 ENCOUNTER — Encounter: Payer: Self-pay | Admitting: Gastroenterology

## 2020-11-15 ENCOUNTER — Other Ambulatory Visit: Payer: Self-pay | Admitting: Gastroenterology

## 2020-11-19 ENCOUNTER — Ambulatory Visit (INDEPENDENT_AMBULATORY_CARE_PROVIDER_SITE_OTHER): Payer: Managed Care, Other (non HMO) | Admitting: Physician Assistant

## 2020-11-19 ENCOUNTER — Encounter: Payer: Self-pay | Admitting: Physician Assistant

## 2020-11-19 VITALS — BP 110/82 | HR 88 | Ht 67.0 in | Wt 150.4 lb

## 2020-11-19 DIAGNOSIS — K5190 Ulcerative colitis, unspecified, without complications: Secondary | ICD-10-CM

## 2020-11-19 MED ORDER — BUDESONIDE 3 MG PO CPEP: ORAL_CAPSULE | ORAL | 0 refills | Status: DC

## 2020-11-19 MED ORDER — MESALAMINE 1.2 G PO TBEC: DELAYED_RELEASE_TABLET | ORAL | 3 refills | Status: DC

## 2020-11-19 NOTE — Patient Instructions (Signed)
Your provider has requested that you go to the basement level for lab work in three months. Press "B" on the elevator. The lab is located at the first door on the left as you exit the elevator.   Please follow up with Dr. Silverio Decamp in 6 months

## 2020-11-19 NOTE — Addendum Note (Signed)
Addended by: Audrea Muscat on: 11/19/2020 10:13 AM   Modules accepted: Orders

## 2020-11-19 NOTE — Progress Notes (Signed)
Chief Complaint: Follow-up change in bowel habits  HPI:    Rachael Walker is a 60 year old female with past medical history as listed below, known to Dr. Silverio Decamp, who presents clinic today for follow-up of a change in bowel habits.      10/14/2020 patient saw Tye Savoy, NP for a 6-week history of left lower quadrant tenderness and a change in stool consistency and presence of blood and mucus, labs and CT unrevealing.  Patient was scheduled for colonoscopy.    10/24/2020 colonoscopy with inadequate preparation of the colon, 1 5 mm polyp in the ascending colon, patchy mild inflammation in the rectum, sigmoid colon and descending colon secondary to colitis as well as nonbleeding external and internal hemorrhoids.  Patient was given budesonide 9 mg daily x30 days.  Pathology showed sessile serrated polyp without dysplasia and moderately active chronic colitis consistent with IBD.  Repeat was recommended in 1 year given poor prep.    Today, the patient presents to clinic and tells me that she is about 90% better since being on the Budesonide 9 mg over the past month.  Tells me that her bowel movements are almost normal now and she really sees no blood though occasionally she will have some mucus and the other day it was kind of pink/purplish in color.  Tells me she will also have occasional stools which are like a "warm smoothie".  Is only having 1 bowel movement a day.  Overall much improved.  Denies continued abdominal pain.    Denies fever, chills or weight loss.  Past Medical/ Past Surgical History:  Procedure Laterality Date  . BUNIONECTOMY Right   . COLONOSCOPY  2009   w/[name]  . COLONOSCOPY  10/24/2020   nandigam  . COLONOSCOPY  2018    Current Outpatient Medications  Medication Sig Dispense Refill  . budesonide (ENTOCORT EC) 3 MG 24 hr capsule TAKE 3 CAPSULES (9 MG TOTAL) BY MOUTH DAILY. 90 capsule 0  . estradiol (ESTRACE) 2 MG tablet estradiol 2 mg tablet  TAKE 1 TABLET BY MOUTH  EVERY DAY    . progesterone (PROMETRIUM) 100 MG capsule progesterone micronized 100 mg capsule     No current facility-administered medications for this visit.    Allergies as of 11/19/2020  . (No Known Allergies)    Family History  Problem Relation Age of Onset  . Osteoporosis Mother   . Colon polyps Mother   . Cancer Father   . Diabetes Father   . Hyperlipidemia Father   . Colon polyps Brother   . Cancer Maternal Grandmother        kidney  . Heart disease Maternal Grandfather   . Diabetes Paternal Grandmother   . Diabetes Brother   . Colon cancer Neg Hx     Social History   Socioeconomic History  . Marital status: Married    Spouse name: Not on file  . Number of children: Not on file  . Years of education: Not on file  . Highest education level: Not on file  Occupational History  . Not on file  Tobacco Use  . Smoking status: Never Smoker  . Smokeless tobacco: Never Used  Vaping Use  . Vaping Use: Never used  Substance and Sexual Activity  . Alcohol use: Yes    Comment: occ. -2 times a month per pt  . Drug use: No  . Sexual activity: Not on file  Other Topics Concern  . Not on file  Social History Narrative  .  Not on file   Social Determinants of Health   Financial Resource Strain: Not on file  Food Insecurity: Not on file  Transportation Needs: Not on file  Physical Activity: Not on file  Stress: Not on file  Social Connections: Not on file  Intimate Partner Violence: Not on file    Review of Systems:    Constitutional: No weight loss, fever or chills Cardiovascular: No chest pain  Respiratory: No SOB  Gastrointestinal: See HPI and otherwise negative   Physical Exam:  Vital signs: BP 110/82   Pulse 88   Ht 5\' 7"  (1.702 m)   Wt 150 lb 6 oz (68.2 kg)   LMP 02/24/2015 Comment: per pt doesn't last long, it's was for 1 day  BMI 23.55 kg/m   Constitutional:   Pleasant Caucasian female appears to be in NAD, Well developed, Well nourished, alert  and cooperative Respiratory: Respirations even and unlabored. Lungs clear to auscultation bilaterally.   No wheezes, crackles, or rhonchi.  Cardiovascular: Normal S1, S2. No MRG. Regular rate and rhythm. No peripheral edema, cyanosis or pallor.  Gastrointestinal:  Soft, nondistended, nontender. No rebound or guarding. Normal bowel sounds. No appreciable masses or hepatomegaly. Rectal:  Not performed.  Psychiatric:  Demonstrates good judgement and reason without abnormal affect or behaviors.  RELEVANT LABS AND IMAGING: CBC    Component Value Date/Time   WBC 4.2 09/26/2020 1623   RBC 4.10 09/26/2020 1623   HGB 12.1 09/26/2020 1623   HCT 36.9 09/26/2020 1623   PLT 204 09/26/2020 1623   MCV 90.0 09/26/2020 1623   MCH 29.5 09/26/2020 1623   MCHC 32.8 09/26/2020 1623   RDW 12.4 09/26/2020 1623    CMP     Component Value Date/Time   NA 140 09/26/2020 1623   K 3.8 09/26/2020 1623   CL 103 09/26/2020 1623   CO2 28 09/26/2020 1623   GLUCOSE 104 (H) 09/26/2020 1623   BUN 11 09/26/2020 1623   CREATININE 0.85 09/26/2020 1623   CALCIUM 8.6 09/26/2020 1623   PROT 6.3 09/26/2020 1623   ALBUMIN 4.4 07/20/2018 1518   AST 25 09/26/2020 1623   ALT 15 09/26/2020 1623   ALKPHOS 106 07/20/2018 1518   BILITOT 0.3 09/26/2020 1623   GFRNONAA >60 06/08/2017 1621   GFRAA >60 06/08/2017 1621    Assessment: 1.  Ulcerative colitis: New diagnosis as of recent colonoscopy in May, budesonide 9 mg over the past month has made her symptoms 90% better  Plan: 1.  Discussed case with Dr. Silverio Decamp.  She would like patient to stay on Budesonide for another month 9 mg a day and then start tapering to 6 mg x 2 weeks then 3 mg x 4 weeks.  She would also like patient to start on Lialda 2 tabs once a day in a month and then 4 tabs a day in 2 months. 2.  Patient will have repeat CBC and CMP in 3 months 3.  Patient to follow in clinic with Dr. Silverio Decamp in [place] at her next available  appointment.  Ellouise Newer, PA-C [facility] 11/19/2020, 8:32 AM  Cc: Darreld Mclean, MD

## 2020-11-21 NOTE — Progress Notes (Signed)
Reviewed and agree with documentation and assessment and plan. K. Veena Shital Crayton , MD   

## 2020-12-13 ENCOUNTER — Other Ambulatory Visit: Payer: Self-pay | Admitting: Gastroenterology

## 2020-12-24 ENCOUNTER — Emergency Department (HOSPITAL_BASED_OUTPATIENT_CLINIC_OR_DEPARTMENT_OTHER)
Admission: EM | Admit: 2020-12-24 | Discharge: 2020-12-24 | Disposition: A | Discharge status: Home / Self Care | Payer: Managed Care, Other (non HMO) | Attending: Emergency Medicine | Admitting: Emergency Medicine

## 2020-12-24 ENCOUNTER — Encounter (HOSPITAL_BASED_OUTPATIENT_CLINIC_OR_DEPARTMENT_OTHER): Payer: Self-pay | Admitting: *Deleted

## 2020-12-24 ENCOUNTER — Other Ambulatory Visit: Payer: Self-pay

## 2020-12-24 ENCOUNTER — Telehealth: Payer: Self-pay | Admitting: Internal Medicine

## 2020-12-24 ENCOUNTER — Emergency Department (HOSPITAL_BASED_OUTPATIENT_CLINIC_OR_DEPARTMENT_OTHER): Payer: Managed Care, Other (non HMO) | Admitting: Radiology

## 2020-12-24 DIAGNOSIS — Y9289 Other specified places as the place of occurrence of the external cause: Secondary | ICD-10-CM | POA: Diagnosis not present

## 2020-12-24 DIAGNOSIS — S6992XA Unspecified injury of left wrist, hand and finger(s), initial encounter: Secondary | ICD-10-CM | POA: Diagnosis present

## 2020-12-24 DIAGNOSIS — Y93B9 Activity, other involving muscle strengthening exercises: Secondary | ICD-10-CM | POA: Diagnosis not present

## 2020-12-24 DIAGNOSIS — S52532A Colles' fracture of left radius, initial encounter for closed fracture: Secondary | ICD-10-CM | POA: Diagnosis not present

## 2020-12-24 DIAGNOSIS — W1830XA Fall on same level, unspecified, initial encounter: Secondary | ICD-10-CM | POA: Diagnosis not present

## 2020-12-24 DIAGNOSIS — S52602A Unspecified fracture of lower end of left ulna, initial encounter for closed fracture: Secondary | ICD-10-CM | POA: Insufficient documentation

## 2020-12-24 MED ORDER — ETOMIDATE 2 MG/ML IV SOLN
8.0000 mg | Freq: Once | INTRAVENOUS | Status: AC
  Administered 2020-12-24: 8 mg via INTRAVENOUS
  Filled 2020-12-24: qty 10

## 2020-12-24 MED ORDER — SENNOSIDES-DOCUSATE SODIUM 8.6-50 MG PO TABS: 1.0000 | ORAL_TABLET | Freq: Every evening | ORAL | 0 refills | Status: DC | PRN

## 2020-12-24 MED ORDER — FENTANYL CITRATE (PF) 100 MCG/2ML IJ SOLN
50.0000 ug | Freq: Once | INTRAMUSCULAR | Status: AC
  Administered 2020-12-24: 50 ug via INTRAVENOUS
  Filled 2020-12-24: qty 2

## 2020-12-24 MED ORDER — ONDANSETRON HCL 4 MG/2ML IJ SOLN
4.0000 mg | Freq: Once | INTRAMUSCULAR | Status: AC
  Administered 2020-12-24: 4 mg via INTRAVENOUS
  Filled 2020-12-24: qty 2

## 2020-12-24 MED ORDER — OXYCODONE-ACETAMINOPHEN 5-325 MG PO TABS: 1.0000 | ORAL_TABLET | Freq: Four times a day (QID) | ORAL | 0 refills | Status: DC | PRN

## 2020-12-24 MED ORDER — PROPOFOL 10 MG/ML IV BOLUS: 30.0000 mg | Freq: Once | INTRAVENOUS | Status: DC

## 2020-12-24 NOTE — ED Notes (Signed)
Time out was done at 0824, delay in actual charting.

## 2020-12-24 NOTE — ED Triage Notes (Signed)
Pt fell this morning with exercise trainer as she fell on her left wrist. Deformity noted.

## 2020-12-24 NOTE — Telephone Encounter (Signed)
Received phone call from patient today She told me about her wrist fracture   While in ER she says her HR was slow (40s)  BP was OK    SHe was not dizzy    Ms Daughtrey has asked me to establish as a provider for patient and review EKG    I told her I would    EKG shows SB First degree AV block  PR 208 msec LVH with early repolarization  PT is very active aerobically   No SOB   No CP  No dizziness  Will review findings with her after she recovers from immediate injury.  Dorris Carnes MD

## 2020-12-24 NOTE — ED Notes (Signed)
pts husband Pilar Plate will transport pt home

## 2020-12-24 NOTE — ED Provider Notes (Signed)
Emergency Department Provider Note   I have reviewed the triage vital signs and the nursing notes.   HISTORY  Chief Complaint Fall   HPI Rachael Walker is a 60 y.o. female with past medical history reviewed below presents to the emergency department with left wrist pain and deformity.  Patient fell while performing a workout this morning.  She states she was doing a balance type activity and lost her balance falling backwards landing on her butt.  She put her wrist out to break her fall and injured the wrist.  There is deformity but patient denies any numbness.  She did not strike her head or injure other parts of her body. Last PO was last night.   History reviewed. No pertinent past medical history.  Patient Active Problem List   Diagnosis Date Noted   Rupture of medial head of gastrocnemius, left, subsequent encounter 01/02/2020   Posterior pain of right hip 12/10/2016   BUNION, RIGHT FOOT 04/23/2010   ABNORMALITY OF GAIT 04/23/2010   UNEQUAL LEG LENGTH 03/31/2010   PLANTAR FASCIITIS, BILATERAL 03/13/2010   STRESS FRACTURE, FOOT 03/13/2010    Past Surgical History:  Procedure Laterality Date   BUNIONECTOMY Right    COLONOSCOPY  2009   w/[name]   COLONOSCOPY  10/24/2020   nandigam   COLONOSCOPY  2018    Allergies Patient has no known allergies.  Family History  Problem Relation Age of Onset   Osteoporosis Mother    Colon polyps Mother    Cancer Father    Diabetes Father    Hyperlipidemia Father    Colon polyps Brother    Cancer Maternal Grandmother        kidney   Heart disease Maternal Grandfather    Diabetes Paternal Grandmother    Diabetes Brother    Colon cancer Neg Hx     Social History Social History   Tobacco Use   Smoking status: Never   Smokeless tobacco: Never  Vaping Use   Vaping Use: Never used  Substance Use Topics   Alcohol use: Yes    Comment: occ. -2 times a month per pt   Drug use: No    Review of  Systems  Constitutional: No fever/chills Cardiovascular: Denies chest pain. Respiratory: Denies shortness of breath. Gastrointestinal: No abdominal pain.  Musculoskeletal: Positive left wrist pain and deformity.  Skin: Negative for laceration.  Neurological: Negative for numbness.  10-point ROS otherwise negative.  ____________________________________________   PHYSICAL EXAM:  VITAL SIGNS: ED Triage Vitals  Enc Vitals Group     BP 12/24/20 0727 108/74     Pulse Rate 12/24/20 0727 (!) 42     Resp 12/24/20 0727 16     Temp 12/24/20 0727 (!) 97.5 F (36.4 C)     Temp Source 12/24/20 0727 Oral     SpO2 12/24/20 0727 100 %     Weight 12/24/20 0729 150 lb (68 kg)     Height 12/24/20 0729 5\' 7"  (1.702 m)   Constitutional: Alert and oriented. Well appearing and in no acute distress. Eyes: Conjunctivae are normal.  Head: Atraumatic. Nose: No congestion/rhinnorhea. Mouth/Throat: Mucous membranes are moist.   Neck: No stridor.   Cardiovascular: Bradycardia. Good peripheral circulation. Grossly normal heart sounds. 2+ radial pulse on the left with good capillary refill.  Respiratory: Normal respiratory effort.  No retractions. Lungs CTAB. Gastrointestinal: No distention.  Musculoskeletal: No lower extremity tenderness nor edema. No gross deformities of extremities. Ambulatory. Left wrist deformity distally with no  elbow tenderness. No laceration.  Neurologic:  Normal speech and language. No gross focal neurologic deficits are appreciated.  Skin:  Skin is warm, dry and intact. No rash noted.  ____________________________________________  RADIOLOGY  No results found.   EKG:    EKG Interpretation  Date/Time:  Tuesday December 24 2020 08:03:16 EDT Ventricular Rate:  41 PR Interval:  207 QRS Duration: 85 QT Interval:  489 QTC Calculation: 404 R Axis:   84 Text Interpretation: Sinus bradycardia RSR' in V1 or V2, probably normal variant Left ventricular hypertrophy No old  tracing for comparison Confirmed by Nanda Quinton [phone]) on 12/24/2020 8:10:18 AM          ____________________________________________   PROCEDURES  Procedure(s) performed:   Reduction of fracture  Date/Time: 12/24/2020 8:38 AM Performed by: Margette Fast, MD Authorized by: Margette Fast, MD  Consent: Written consent obtained. Risks and benefits: risks, benefits and alternatives were discussed Consent given by: patient Required items: required blood products, implants, devices, and special equipment available Patient identity confirmed: verbally with patient and arm band Time out: Immediately prior to procedure a "time out" was called to verify the correct patient, procedure, equipment, support staff and site/side marked as required. Local anesthesia used: no  Anesthesia: Local anesthesia used: no  Sedation: Patient sedated: yes Sedation type: (deep) Sedatives: etomidate Analgesia: fentanyl Vitals: Vital signs were monitored during sedation.  Patient tolerance: patient tolerated the procedure well with no immediate complications Comments: The patient's fracture was pulled to length and reduced.  Deformity of the left wrist grossly improved.  Pulses and sensation remain intact. Patient tolerated procedure well.    .Sedation  Date/Time: 12/24/2020 8:40 AM Performed by: Margette Fast, MD Authorized by: Margette Fast, MD   Consent:    Consent obtained:  Written   Consent given by:  Patient   Risks discussed:  Allergic reaction, nausea, vomiting, respiratory compromise necessitating ventilatory assistance and intubation, prolonged sedation necessitating reversal, prolonged hypoxia resulting in organ damage, inadequate sedation and dysrhythmia Universal protocol:    Immediately prior to procedure, a time out was called: yes     Patient identity confirmed:  Verbally with patient and arm band Indications:    Procedure performed:  Fracture reduction Pre-sedation  assessment:    Time since last food or drink:  8 hours   ASA classification: class 1 - normal, healthy patient     Mallampati score:  I - soft palate, uvula, fauces, pillars visible   Pre-sedation assessments completed and reviewed: airway patency, cardiovascular function, hydration status, mental status, nausea/vomiting, pain level, respiratory function and temperature   Immediate pre-procedure details:    Reassessment: Patient reassessed immediately prior to procedure     Reviewed: vital signs     Verified: bag valve mask available, emergency equipment available, intubation equipment available, IV patency confirmed, oxygen available and suction available   Procedure details (see MAR for exact dosages):    Preoxygenation:  Room air   Sedation:  Etomidate   Intended level of sedation: deep   Analgesia:  Fentanyl   Intra-procedure monitoring:  Blood pressure monitoring, cardiac monitor, continuous pulse oximetry, continuous capnometry, frequent LOC assessments and frequent vital sign checks   Intra-procedure events: none     Total Provider sedation time (minutes):  30 Post-procedure details:    Attendance: Constant attendance by certified staff until patient recovered     Recovery: Patient returned to pre-procedure baseline     Post-sedation assessments completed and reviewed: airway patency, cardiovascular function,  hydration status, mental status, nausea/vomiting, pain level, respiratory function and temperature     Patient is stable for discharge or admission: yes     Procedure completion:  Tolerated well, no immediate complications .Splint Application  Date/Time: 12/24/2020 8:41 AM Performed by: Margette Fast, MD Authorized by: Margette Fast, MD   Consent:    Consent obtained:  Verbal   Consent given by:  Patient   Risks, benefits, and alternatives were discussed: yes     Risks discussed:  Numbness, pain, swelling and discoloration Universal protocol:    Immediately prior to  procedure a time out was called: yes     Patient identity confirmed:  Verbally with patient and arm band Pre-procedure details:    Distal neurologic exam:  Normal   Distal perfusion: distal pulses strong   Procedure details:    Location:  Wrist   Wrist location:  L wrist   Strapping: no     Cast type:  Short arm   Splint type:  Sugar tong   Supplies:  Cotton padding, sling and fiberglass   Attestation: Splint applied and adjusted personally by me   Post-procedure details:    Distal neurologic exam:  Normal   Distal perfusion: distal pulses strong     Procedure completion:  Tolerated well, no immediate complications   Post-procedure imaging: reviewed     ____________________________________________   INITIAL IMPRESSION / ASSESSMENT AND PLAN / ED COURSE  Pertinent labs & imaging results that were available during my care of the patient were reviewed by me and considered in my medical decision making (see chart for details).   Patient presents to the emergency department with left wrist deformity after fall.  There is an obvious deformity but no open fracture.  The hand is neurovascularly intact.  Plan for plain films and likely reduction with splinting and hand surgery follow-up.  Bradycardia noted. EKG reviewed. Mild PR prolongation but no more advanced block or clear ischemia.   Reduction performed under sedation without complication. Splint applied. Spoke with Dr. Grandville Silos who will coordinate follow up.  ____________________________________________  FINAL CLINICAL IMPRESSION(S) / ED DIAGNOSES  Final diagnoses:  Closed Colles' fracture of left radius, initial encounter     MEDICATIONS GIVEN DURING THIS VISIT:  Medications  fentaNYL (SUBLIMAZE) injection 50 mcg (50 mcg Intravenous Given 12/24/20 0738)  ondansetron (ZOFRAN) injection 4 mg (4 mg Intravenous Given 12/24/20 0756)  etomidate (AMIDATE) injection 8 mg (8 mg Intravenous Given 12/24/20 0826)  fentaNYL (SUBLIMAZE)  injection 50 mcg (50 mcg Intravenous Given 12/24/20 0856)     NEW OUTPATIENT MEDICATIONS STARTED DURING THIS VISIT:  Discharge Medication List as of 12/24/2020  9:32 AM     START taking these medications   Details  oxyCODONE-acetaminophen (PERCOCET/ROXICET) 5-325 MG tablet Take 1 tablet by mouth every 6 (six) hours as needed for severe pain., Starting Tue 12/24/2020, Normal    senna-docusate (SENOKOT-S) 8.6-50 MG tablet Take 1 tablet by mouth at bedtime as needed for mild constipation or moderate constipation., Starting Tue 12/24/2020, Normal        Note:  This document was prepared using Dragon voice recognition software and may include unintentional dictation errors.  Nanda Quinton, MD, [facility] Emergency Medicine    Alton Tremblay, Wonda [facility], MD 12/25/20 [phone]

## 2020-12-24 NOTE — ED Notes (Signed)
Reduction of left wrist by Dr Laverta Baltimore followed by x ray at 0830, Sugar tong splint placed by Dr Laverta Baltimore and Charlett Nose EMT. Pt responding appropriately to questions. Pain has improved. Husband returned to bedside

## 2020-12-24 NOTE — ED Notes (Signed)
Sedation given, Dr Laverta Baltimore reduced left wrist. Confirmed at [phone] by x ray. Sugar tong splint applied by Charlett Nose EMT and Dr Laverta Baltimore. Pt responding appropriately to questions. Husband at bedside. Tol procedure well.

## 2020-12-24 NOTE — Discharge Instructions (Addendum)
You were seen in the emergency department today after a fall with wrist fracture.  We were able to reduce this with good alignment but you will need to follow with the hand surgeon, Dr. Grandville Silos.  His office will be calling you either today or tomorrow to schedule a follow-up appointment.  Be sure to keep your fingers moving.  Come out of your sling to help move your shoulder to keep it from getting stiff and sore.  Keep your wrist elevated above the level of your heart when at rest to reduce swelling.  You may also apply a cool compress this morning to help reduce swelling and pain.  You can alternate Tylenol and/or ibuprofen over-the-counter for discomfort.  I have also called in Percocet to your pharmacy.  This can cause drowsiness and you cannot take it with alcohol or if you plan on driving.  You cannot take it with other pain or sedation/anxiety medications.

## 2020-12-24 NOTE — ED Notes (Signed)
Etomidate 12 mg wasted in med dispenser. Witnessed by [employer]

## 2020-12-24 NOTE — ED Notes (Signed)
Pt sedated and expresses no pain during procedure.

## 2021-01-03 HISTORY — PX: WRIST FRACTURE SURGERY: SHX121

## 2021-02-14 ENCOUNTER — Encounter: Payer: Self-pay | Admitting: Family Medicine

## 2021-02-14 DIAGNOSIS — S62109D Fracture of unspecified carpal bone, unspecified wrist, subsequent encounter for fracture with routine healing: Secondary | ICD-10-CM

## 2021-02-19 ENCOUNTER — Other Ambulatory Visit (INDEPENDENT_AMBULATORY_CARE_PROVIDER_SITE_OTHER): Payer: Managed Care, Other (non HMO)

## 2021-02-19 DIAGNOSIS — K5190 Ulcerative colitis, unspecified, without complications: Secondary | ICD-10-CM

## 2021-02-19 LAB — COMPREHENSIVE METABOLIC PANEL
ALT: 14 U/L (ref 0–35)
AST: 26 U/L (ref 0–37)
Albumin: 3.8 g/dL (ref 3.5–5.2)
Alkaline Phosphatase: 58 U/L (ref 39–117)
BUN: 12 mg/dL (ref 6–23)
CO2: 29 mEq/L (ref 19–32)
Calcium: 8.8 mg/dL (ref 8.4–10.5)
Chloride: 105 mEq/L (ref 96–112)
Creatinine, Ser: 0.76 mg/dL (ref 0.40–1.20)
GFR: 85.33 mL/min (ref >60.00)
Glucose, Bld: 88 mg/dL (ref 70–99)
Potassium: 4.1 mEq/L (ref 3.5–5.1)
Sodium: 140 mEq/L (ref 135–145)
Total Bilirubin: 0.4 mg/dL (ref 0.2–1.2)
Total Protein: 6.6 g/dL (ref 6.0–8.3)

## 2021-02-19 LAB — CBC WITH DIFFERENTIAL/PLATELET
Basophils Absolute: 0 10*3/uL (ref 0.0–0.1)
Basophils Relative: 0.7 % (ref 0.0–3.0)
Eosinophils Absolute: 0.1 10*3/uL (ref 0.0–0.7)
Eosinophils Relative: 3.1 % (ref 0.0–5.0)
HCT: 38.4 % (ref 36.0–46.0)
Hemoglobin: 12.8 g/dL (ref 12.0–15.0)
Lymphocytes Relative: 26.9 % (ref 12.0–46.0)
Lymphs Abs: 1.1 10*3/uL (ref 0.7–4.0)
MCHC: 33.3 g/dL (ref 30.0–36.0)
MCV: 90 fl (ref 78.0–100.0)
Monocytes Absolute: 0.4 10*3/uL (ref 0.1–1.0)
Monocytes Relative: 9.7 % (ref 3.0–12.0)
Neutro Abs: 2.4 10*3/uL (ref 1.4–7.7)
Neutrophils Relative %: 59.6 % (ref 43.0–77.0)
Platelets: 169 10*3/uL (ref 150.0–400.0)
RBC: 4.26 Mil/uL (ref 3.87–5.11)
RDW: 13.3 % (ref 11.5–15.5)
WBC: 4.1 10*3/uL (ref 4.0–10.5)

## 2021-03-17 ENCOUNTER — Other Ambulatory Visit: Payer: Self-pay | Admitting: Family Medicine

## 2021-03-17 DIAGNOSIS — S62109D Fracture of unspecified carpal bone, unspecified wrist, subsequent encounter for fracture with routine healing: Secondary | ICD-10-CM

## 2021-03-26 ENCOUNTER — Ambulatory Visit: Payer: Managed Care, Other (non HMO) | Admitting: Occupational Therapy

## 2021-04-02 ENCOUNTER — Encounter: Payer: Managed Care, Other (non HMO) | Admitting: Occupational Therapy

## 2021-04-02 ENCOUNTER — Ambulatory Visit: Payer: Managed Care, Other (non HMO) | Admitting: Occupational Therapy

## 2021-04-07 ENCOUNTER — Encounter: Payer: Self-pay | Admitting: Family Medicine

## 2021-04-09 ENCOUNTER — Other Ambulatory Visit: Payer: Self-pay

## 2021-04-09 ENCOUNTER — Ambulatory Visit: Payer: Managed Care, Other (non HMO) | Attending: Family Medicine | Admitting: Occupational Therapy

## 2021-04-09 DIAGNOSIS — M25642 Stiffness of left hand, not elsewhere classified: Secondary | ICD-10-CM | POA: Diagnosis present

## 2021-04-09 DIAGNOSIS — R208 Other disturbances of skin sensation: Secondary | ICD-10-CM | POA: Diagnosis present

## 2021-04-09 DIAGNOSIS — M6281 Muscle weakness (generalized): Secondary | ICD-10-CM | POA: Diagnosis present

## 2021-04-09 DIAGNOSIS — M25632 Stiffness of left wrist, not elsewhere classified: Secondary | ICD-10-CM | POA: Insufficient documentation

## 2021-04-09 NOTE — Patient Instructions (Signed)
Flexor Tendon Gliding (Active Hook Fist)   With fingers and knuckles straight, bend middle and tip joints. Do not bend large knuckles. Repeat _10-15___ times. Do _3-4__ sessions per day.  MP Flexion (Active)   With back of hand on table, bend large knuckles as far as they will go, keeping small joints straight. Repeat _10-15___ times. Do __4-6__ sessions per day. Activity: Reach into a narrow container.*      Finger Flexion / Extension   With palm up, bend fingers of left hand toward palm, making a  fist. Straighten fingers, opening fist. Repeat sequence _10-15___ times per session. Do _4-6__ sessions per day. Hand Variation: Palm down   Copyright  VHI. All rights reserved. AROM: Wrist Extension   .  With __left__ palm down, bend wrist up. Repeat __15__ times per set.  Do __4_ sessions per day.    AROM: Wrist Flexion   With__left___ palm up, bend wrist up. Repeat __15__ times per set.  Do _4_ sessions per day.   AROM: Forearm Pronation / Supination   With __left__ arm in handshake position, slowly rotate palm down until stretch is felt. Relax. Then rotate palm up until stretch is felt. Repeat _15___ times per set. Do _4__ sessions per day.  Copyright  VHI. All rights reserved.  PROM: Wrist Flexion / Extension   Grasp  hand and slowly bend wrist until stretch is felt. Relax. Then stretch as far as possible in opposite direction. Be sure to keep elbow bent.  Hold __10__ sec. each way Repeat _5___ times per set.    Do _2-3__ sessions per day.  Pronation (Passive)   Keep elbow bent at right angle and held firmly to side. Use other hand to turn forearm until palm faces downward. Hold _10___ seconds. Repeat __5__ times. Do _3_ sessions per day.  Supination (Passive)   Keep elbow bent at right angle and held firmly at side. Use other hand to turn forearm until palm faces upward. Hold __10__ seconds. Repeat __5__ times. Do _3__ sessions per day.  Copyright   VHI. All rights reserved.     Opposition (Active)   Touch tip of thumb to nail tip of each finger in turn, making an "O" shape. Repeat __10__ times. Do _4-6___ sessions per day.   MP Flexion (Active)   Bend thumb to touch base of little finger, keeping tip joint straight. Repeat __10-15__ times. Do _4-6___ sessions per day.       IComposite Flexion (Passive)   Use other hand to bend both joints of thumb at the same time. Hold _10___ seconds. Repeat __5__ times. Do _3___ sessions per day.   Composite Extension (Passive)       Abduction (Passive Webspace Stretch)   Stretch thumb away from fingers (to FRONT of palm), using opposite hand. Hold _10___ seconds. Repeat __5__ times. Do __4-6__ sessions per day. Activity: Wrap thumb around coffee mug.*

## 2021-04-09 NOTE — Therapy (Addendum)
[facility] [address] [place], [place], [address] Phone: [phone]   Fax:  [phone]  Occupational Therapy Evaluation  Patient Details  Name: Rachael Walker MRN: [id number] Date of Birth: 1960-06-17 Referring Provider (OT): Dr. Lorelei Pont   Encounter Date: 04/09/2021   OT End of Session - 04/09/21 0926     Visit Number 1    Number of Visits 11    Date for OT Re-Evaluation 05/21/21    Authorization Type cigna    OT Start Time 0805    OT Stop Time 0848    OT Time Calculation (min) 43 min    Activity Tolerance Patient tolerated treatment well    Behavior During Therapy [facility] for tasks assessed/performed             No past medical history on file.  Past Surgical History:  Procedure Laterality Date   BUNIONECTOMY Right    COLONOSCOPY  2009   w/[name]   COLONOSCOPY  10/24/2020   nandigam   COLONOSCOPY  2018    There were no vitals filed for this visit.   Subjective Assessment - 04/09/21 0927     Pertinent History Pt s/p injury and colles fx L ulna and distal radius 12/24/20, underwent ORIF on7/22/22    Patient Stated Goals improve LUE wrist and hand function    Currently in Pain? No/denies               [facility] OT Assessment - 04/09/21 0908       Assessment   Medical Diagnosis distal radius and ulna,  colles fx, s/p ORIF    Referring Provider (OT) Dr. Lorelei Pont   Onset Date/Surgical Date 01/03/21   surgery date     Precautions   Precautions None      Balance Screen   Has the patient fallen in the past 6 months Yes    How many times? 1    Has the patient had a decrease in activity level because of a fear of falling?  No    Is the patient reluctant to leave their home because of a fear of falling?  No      Home  Environment   Family/patient expects to be discharged to: Private residence    Lives With Spouse;Family      Prior Function   Level of Independence Independent    Vocation  Full time employment    Vocation Requirements works from home      ADL   ADL comments modified I with all basic ADLS, reports difficulty brushing hair and pulling hair back in a pony tail, pt reports she is unable to tolerate full weightbearing      Mobility   Mobility Status Independent      Written Expression   Dominant Hand Right      Sensation   Light Touch Impaired by gross assessment   for ring and small fingers     Coordination   Other muscle wasting at thenar eminence, webspace and palm    Coordination decreased LUE coordination due to ROM limitations      ROM / Strength   AROM / PROM / Strength AROM      AROM   Overall AROM  Deficits    Overall AROM Comments wrist flexion/ extension: 50/50, supination/ pronation85/85, grossly 95% composite flexion eand extension, -15 for ring finger PIP joint      Left Hand AROM   L Thumb MCP 0-60 35 Degrees  L Thumb IP 0-80 65 Degrees    L Thumb Radial ADduction/ABduction 0-55 50    L Thumb Palmar ADduction/ABduction 0-45 60    L Thumb Opposition to Index --   difficulty opposing 5th digit     Hand Function   Right Hand Grip (lbs) 72.9    Left Hand Grip (lbs) 14.5                  Treatment: Paraffin to LUE for stiffness x 10 mins no adverse reactions.            OT Education - 04/09/21 1303     Education Details inital HEP see pt instructions    Person(s) Educated Patient    Methods Explanation;Demonstration;Verbal cues;Handout    Comprehension Verbal cues required;Verbalized understanding;Returned demonstration                 OT Long Term Goals - 04/09/21 0923       OT LONG TERM GOAL #1   Title I with HEP    Time 5    Period Weeks    Status New      OT LONG TERM GOAL #2   Title Pt will increase LUE grip strength by 5 lbs for increased LUE functional use.    Baseline LUE 14.5, RUE 72.9    Time 5    Period Weeks    Status New      OT LONG TERM GOAL #3   Title Pt will demonstrate  55* wrist flexion/ extension for increased ease with ADLs.    Baseline wrist flexion/ extension:55/55    Time 5    Period Weeks    Status New      OT LONG TERM GOAL #4   Title Pt will report increased ease with brushing hair and pulling hair back into a ponytail.    Time 5    Period Weeks    Status New      OT LONG TERM GOAL #5   Title Pt will increase Thumb MP flexion to 45* for increased ease with ADLS.    Baseline thumb MP flexion 35*    Time 5    Period Weeks    Status New      Long Term Additional Goals   Additional Long Term Goals Yes      OT LONG TERM GOAL #6   Title Pt will demonstrate ability to perform modified weight bearing though left wrist with pain no greater than 2/10.    Time 5    Period Weeks    Status New                   Plan - 04/09/21 0901     Clinical Impression Statement Pt is a 60 y.o female s/p colles fx of radius and ulna on 12/24/20, pt underwent surgery  on 01/03/21. Pt has recieved PT to address injury and dry needling, pt has still not regained the ROM and strength that she desires.Pt presents with the following deficits: decreased strength, decreased ROM, decreased LUE functional use, sensory deficits, muscle wasting at palm and thenar eminence.Pt can benefit from skilled occupational therapy to address these deficits in order to maximize pt's safety and I with ADLs/ IADLs.    OT Occupational Profile and History Problem Focused Assessment - Including review of records relating to presenting problem    Occupational performance deficits (Please refer to evaluation for details): ADL's;IADL's;Work;Leisure;Social Participation    Body Structure / Function /  Physical Skills ADL;Strength;UE functional use;Flexibility;FMC;Pain;Coordination;GMC;ROM;Scar mobility;Decreased knowledge of use of DME;Dexterity;Sensation;IADL    Rehab Potential Good    Clinical Decision Making Limited treatment options, no task modification necessary    Comorbidities  Affecting Occupational Performance: None    Modification or Assistance to Complete Evaluation  No modification of tasks or assist necessary to complete eval    OT Frequency 2x / week    OT Duration --   5 weeks plus eval   OT Treatment/Interventions Self-care/ADL training;Therapeutic exercise;Ultrasound;Neuromuscular education;Manual Therapy;Therapeutic activities;Splinting;Cryotherapy;Paraffin;DME and/or AE instruction;Scar mobilization;Fluidtherapy;Electrical Stimulation;Moist Heat;Contrast Bath;Passive range of motion;Patient/family education    Plan add to HEP prn, consider paraffin, [place]    Consulted and Agree with Plan of Care Patient             Patient will benefit from skilled therapeutic intervention in order to improve the following deficits and impairments:   Body Structure / Function / Physical Skills: ADL, Strength, UE functional use, Flexibility, FMC, Pain, Coordination, GMC, ROM, Scar mobility, Decreased knowledge of use of DME, Dexterity, Sensation, IADL       Visit Diagnosis: Stiffness of left wrist, not elsewhere classified - Plan: Ot plan of care cert/re-cert  Stiffness of left hand, not elsewhere classified - Plan: Ot plan of care cert/re-cert  Muscle weakness (generalized) - Plan: Ot plan of care cert/re-cert  Other disturbances of skin sensation - Plan: Ot plan of care cert/re-cert  Physician: Dr. Lorelei Pont  Certification Start Date: 56/31/49 Certification End Date: 05/21/21  Physician Documentation Your signature is required to indicate approval of the treatment plan as stated above.  Please sign and either send electronically or make a copy of this report for your files and return this physician signed original.  Please mark one 1.__approve of plan   2. ___approve of plan with the following conditions.  ____________________________________________________________________________________________________________________________________________   ______________________                                                       _____________________  Physician Signature                                                                     Date      Problem List Patient Active Problem List   Diagnosis Date Noted   Rupture of medial head of gastrocnemius, left, subsequent encounter 01/02/2020   Posterior pain of right hip 12/10/2016   BUNION, RIGHT FOOT 04/23/2010   ABNORMALITY OF GAIT 04/23/2010   UNEQUAL LEG LENGTH 03/31/2010   PLANTAR FASCIITIS, BILATERAL 03/13/2010   STRESS FRACTURE, FOOT 03/13/2010      Faxed to MD for signature   Cordella Nyquist, OT/L 04/09/2021, 1:04 PM [name], OTR/L [phone] Phone: [phone] 1:04 PM 04/09/21  [facility] [address] [place], [place], [address] Phone: [phone]   Fax:  [phone]  Name: MARIA COIN MRN: [id number] Date of Birth: [birth year]

## 2021-04-14 ENCOUNTER — Other Ambulatory Visit: Payer: Self-pay

## 2021-04-14 ENCOUNTER — Ambulatory Visit: Payer: Managed Care, Other (non HMO) | Admitting: Occupational Therapy

## 2021-04-14 DIAGNOSIS — M25632 Stiffness of left wrist, not elsewhere classified: Secondary | ICD-10-CM

## 2021-04-14 DIAGNOSIS — R208 Other disturbances of skin sensation: Secondary | ICD-10-CM

## 2021-04-14 DIAGNOSIS — M6281 Muscle weakness (generalized): Secondary | ICD-10-CM

## 2021-04-14 DIAGNOSIS — M25642 Stiffness of left hand, not elsewhere classified: Secondary | ICD-10-CM

## 2021-04-14 NOTE — Therapy (Addendum)
[facility] [address] [place], [place], [address] Phone: [phone]   Fax:  [phone]  Occupational Therapy Treatment  Patient Details  Name: Rachael Walker MRN: [id number] Date of Birth: 03/07/1961 Referring Provider (OT): Dr. Lorelei Pont   Encounter Date: 04/14/2021   OT End of Session - 04/14/21 1005     Visit Number 2    Number of Visits 11    Date for OT Re-Evaluation 05/21/21    Authorization Type cigna    OT Start Time 0805    OT Stop Time 0845    OT Time Calculation (min) 40 min             No past medical history on file.  Past Surgical History:  Procedure Laterality Date   BUNIONECTOMY Right    COLONOSCOPY  2009   w/[name]   COLONOSCOPY  10/24/2020   nandigam   COLONOSCOPY  2018    There were no vitals filed for this visit.   Subjective Assessment - 04/14/21 1004     Subjective  Denies pain at rest    Pertinent History Pt s/p injury and colles fx L ulna and distal radius 12/24/20, underwent ORIF on7/22/22   Patient Stated Goals improve LUE wrist and hand function    Currently in Pain? No/denies                 Treatment: Parrafin x 9 mins to LUE for stiffness and pain, no adverse reactions [place] 49mhz, 0.8 w/cm 2, 20% x 8 mins to palm, digits at MP's and PIP's, and thumb no adverse reacions. Tendon gliding exercises, passive stretch in MP flexion, reverse blocking.                OT Education - 04/14/21 1007     Education Details issued red putty exercises for grip, pinch and thumb flexion, instructed pt in reverse blocking    Person(s) Educated Patient    Methods Explanation;Demonstration;Verbal cues    Comprehension Verbal cues required;Verbalized understanding;Returned demonstration                 OT Long Term Goals - 04/09/21 0923       OT LONG TERM GOAL #1   Title I with HEP    Time 5    Period Weeks    Status New      OT LONG TERM GOAL #2    Title Pt will increase LUE grip strength by 5 lbs for increased LUE functional use.    Baseline LUE 14.5, RUE 72.9    Time 5    Period Weeks    Status New      OT LONG TERM GOAL #3   Title Pt will demonstrate 55* wrist flexion/ extension for increased ease with ADLs.    Baseline wrist flexion/ extension:55/55    Time 5    Period Weeks    Status New      OT LONG TERM GOAL #4   Title Pt will report increased ease with brushing hair and pulling hair back into a ponytail.    Time 5    Period Weeks    Status New      OT LONG TERM GOAL #5   Title Pt will increase Thumb MP flexion to 45* for increased ease with ADLS.    Baseline thumb MP flexion 35*    Time 5    Period Weeks    Status New  Long Term Additional Goals   Additional Long Term Goals Yes      OT LONG TERM GOAL #6   Title Pt will demonstrate ability to perfrom modified weight bearing though left wrist with pain no greater than 2/10.    Time 5    Period Weeks    Status New                   Plan - 04/14/21 1005     Clinical Impression Statement Pt is progressing towards goals. She demonstrates understanding of HEP.    OT Occupational Profile and History Problem Focused Assessment - Including review of records relating to presenting problem    Occupational performance deficits (Please refer to evaluation for details): ADL's;IADL's;Work;Leisure;Social Participation    Body Structure / Function / Physical Skills ADL;Strength;UE functional use;Flexibility;FMC;Pain;Coordination;GMC;ROM;Scar mobility;Decreased knowledge of use of DME;Dexterity;Sensation;IADL    Rehab Potential Good    Clinical Decision Making Limited treatment options, no task modification necessary    Comorbidities Affecting Occupational Performance: None    Modification or Assistance to Complete Evaluation  No modification of tasks or assist necessary to complete eval    OT Frequency 2x / week    OT Duration --   5 weeks plus eval   OT  Treatment/Interventions Self-care/ADL training;Therapeutic exercise;Ultrasound;Neuromuscular education;Manual Therapy;Therapeutic activities;Splinting;Cryotherapy;Paraffin;DME and/or AE instruction;Scar mobilization;Fluidtherapy;Electrical Stimulation;Moist Heat;Contrast Bath;Passive range of motion;Patient/family education    Plan paraffin, [place], reveiw putty, ROM and strengthening    Consulted and Agree with Plan of Care Patient             Patient will benefit from skilled therapeutic intervention in order to improve the following deficits and impairments:   Body Structure / Function / Physical Skills: ADL, Strength, UE functional use, Flexibility, FMC, Pain, Coordination, GMC, ROM, Scar mobility, Decreased knowledge of use of DME, Dexterity, Sensation, IADL       Visit Diagnosis: Stiffness of left wrist, not elsewhere classified  Stiffness of left hand, not elsewhere classified  Muscle weakness (generalized)  Other disturbances of skin sensation    Problem List Patient Active Problem List   Diagnosis Date Noted   Rupture of medial head of gastrocnemius, left, subsequent encounter 01/02/2020   Posterior pain of right hip 12/10/2016   BUNION, RIGHT FOOT 04/23/2010   ABNORMALITY OF GAIT 04/23/2010   UNEQUAL LEG LENGTH 03/31/2010   PLANTAR FASCIITIS, BILATERAL 03/13/2010   STRESS FRACTURE, FOOT 03/13/2010    Ashlin Kreps, OT/L 04/14/2021, 10:09 AM   [facility] [address] [place], [place], [address] Phone: [phone]   Fax:  [phone]  Name: Rachael Walker MRN: [id number] Date of Birth: [birth year]

## 2021-04-16 ENCOUNTER — Other Ambulatory Visit: Payer: Self-pay

## 2021-04-16 ENCOUNTER — Ambulatory Visit: Payer: Managed Care, Other (non HMO) | Attending: Family Medicine | Admitting: Occupational Therapy

## 2021-04-16 DIAGNOSIS — M6281 Muscle weakness (generalized): Secondary | ICD-10-CM

## 2021-04-16 DIAGNOSIS — M25642 Stiffness of left hand, not elsewhere classified: Secondary | ICD-10-CM | POA: Diagnosis present

## 2021-04-16 DIAGNOSIS — M25632 Stiffness of left wrist, not elsewhere classified: Secondary | ICD-10-CM

## 2021-04-16 DIAGNOSIS — R208 Other disturbances of skin sensation: Secondary | ICD-10-CM

## 2021-04-16 NOTE — Patient Instructions (Signed)
Wrist Flexion: Resisted    With right palm up, ___1_ pound weight in hand, or water bottle bend wrist up. Return slowly. Repeat with palm down Repeat __10__ times per set. Do ___1_ sets per session. Do __1__ sessions per day.  Repeat with hand turned sideways like shaking someone's hand, 10 reps 1x day Copyright  [employer]. All rights reserved.

## 2021-04-16 NOTE — Therapy (Addendum)
[facility] [address] [place], [place], [address] Phone: [phone]   Fax:  [phone]  Occupational Therapy Treatment  Patient Details  Name: Rachael Walker MRN: [id number] Date of Birth: 1961/05/30 Referring Provider (OT): Dr. Lorelei Pont   Encounter Date: 04/16/2021   OT End of Session - 04/16/21 1003     Visit Number 3    Number of Visits 11    Date for OT Re-Evaluation 05/21/21    Authorization Type cigna    OT Start Time 0805    OT Stop Time 0844    OT Time Calculation (min) 39 min             No past medical history on file.  Past Surgical History:  Procedure Laterality Date   BUNIONECTOMY Right    COLONOSCOPY  2009   w/[name]   COLONOSCOPY  10/24/2020   nandigam   COLONOSCOPY  2018    There were no vitals filed for this visit.   Subjective Assessment - 04/16/21 1003     Subjective  No pain    Pertinent History Pt s/p injury and colles fx L ulna and distal radius 12/24/20, underwent ORIF on7/22/22    Patient Stated Goals improve LUE wrist and hand function    Currently in Pain? No/denies                Treatment: Parrafin x 9 mins to LUE for stiffness and pain, no adverse reactions [place] 59mhz, 0.8 w/cm 2, 20% x 8 mins to palm, digits at MP's and PIP's, and thumb no adverse reacions. Tendon gliding exercises, passive stretch in MP flexion, reverse blocking and thumb flexion. Joint mobs at MP joints for LUE. Graded clothespins for sustained pinch yellow-blue, min difficulty, pt was unable to perform black clothespins as they were too resistive.                   OT Education - 04/16/21 1005     Education Details wrist strengthening with 1 lbs weight, see pt instructions    Person(s) Educated Patient    Methods Explanation;Demonstration;Verbal cues    Comprehension Verbal cues required;Verbalized understanding;Returned demonstration                 OT Long  Term Goals - 04/09/21 4401       OT LONG TERM GOAL #1   Title I with HEP    Time 5    Period Weeks    Status New      OT LONG TERM GOAL #2   Title Pt will increase LUE grip strength by 5 lbs for increased LUE functional use.    Baseline LUE 14.5, RUE 72.9    Time 5    Period Weeks    Status New      OT LONG TERM GOAL #3   Title Pt will demonstrate 55* wrist flexion/ extension for increased ease with ADLs.    Baseline wrist flexion/ extension:55/55    Time 5    Period Weeks    Status New      OT LONG TERM GOAL #4   Title Pt will report increased ease with brushing hair and pulling hair back into a ponytail.    Time 5    Period Weeks    Status New      OT LONG TERM GOAL #5   Title Pt will increase Thumb MP flexion to 45* for increased ease with ADLS.  Baseline thumb MP flexion 35*    Time 5    Period Weeks    Status New      Long Term Additional Goals   Additional Long Term Goals Yes      OT LONG TERM GOAL #6   Title Pt will demonstrate ability to perfrom modified weight bearing though left wrist with pain no greater than 2/10.    Time 5    Period Weeks    Status New                   Plan - 04/16/21 1004     Clinical Impression Statement Pt is progressing towards goals. She demonstrated improving ROM in hand.    OT Occupational Profile and History Problem Focused Assessment - Including review of records relating to presenting problem    Occupational performance deficits (Please refer to evaluation for details): ADL's;IADL's;Work;Leisure;Social Participation    Body Structure / Function / Physical Skills ADL;Strength;UE functional use;Flexibility;FMC;Pain;Coordination;GMC;ROM;Scar mobility;Decreased knowledge of use of DME;Dexterity;Sensation;IADL    Rehab Potential Good    Clinical Decision Making Limited treatment options, no task modification necessary    Comorbidities Affecting Occupational Performance: None    Modification or Assistance to  Complete Evaluation  No modification of tasks or assist necessary to complete eval    OT Frequency 2x / week    OT Duration --   5 weeks plus eval   OT Treatment/Interventions Self-care/ADL training;Therapeutic exercise;Ultrasound;Neuromuscular education;Manual Therapy;Therapeutic activities;Splinting;Cryotherapy;Paraffin;DME and/or AE instruction;Scar mobilization;Fluidtherapy;Electrical Stimulation;Moist Heat;Contrast Bath;Passive range of motion;Patient/family education    Plan paraffin, [place], reveiw putty, ROM and strengthening    Consulted and Agree with Plan of Care Patient             Patient will benefit from skilled therapeutic intervention in order to improve the following deficits and impairments:   Body Structure / Function / Physical Skills: ADL, Strength, UE functional use, Flexibility, FMC, Pain, Coordination, GMC, ROM, Scar mobility, Decreased knowledge of use of DME, Dexterity, Sensation, IADL       Visit Diagnosis: Stiffness of left wrist, not elsewhere classified  Stiffness of left hand, not elsewhere classified  Muscle weakness (generalized)  Other disturbances of skin sensation    Problem List Patient Active Problem List   Diagnosis Date Noted   Rupture of medial head of gastrocnemius, left, subsequent encounter 01/02/2020   Posterior pain of right hip 12/10/2016   BUNION, RIGHT FOOT 04/23/2010   ABNORMALITY OF GAIT 04/23/2010   UNEQUAL LEG LENGTH 03/31/2010   PLANTAR FASCIITIS, BILATERAL 03/13/2010   STRESS FRACTURE, FOOT 03/13/2010    Diksha Tagliaferro, OT/L 04/16/2021, 10:06 AM [name], OTR/L [phone] Phone: [phone] 10:09 AM 04/16/21  [facility] [address] [place], [place], [address] Phone: [phone]   Fax:  [phone]  Name: Rachael Walker MRN: [id number] Date of Birth: 03-07-1961

## 2021-04-22 ENCOUNTER — Other Ambulatory Visit: Payer: Self-pay

## 2021-04-22 ENCOUNTER — Ambulatory Visit: Payer: Managed Care, Other (non HMO) | Admitting: Occupational Therapy

## 2021-04-22 DIAGNOSIS — M25632 Stiffness of left wrist, not elsewhere classified: Secondary | ICD-10-CM

## 2021-04-22 DIAGNOSIS — M25642 Stiffness of left hand, not elsewhere classified: Secondary | ICD-10-CM

## 2021-04-22 DIAGNOSIS — M6281 Muscle weakness (generalized): Secondary | ICD-10-CM

## 2021-04-22 NOTE — Therapy (Addendum)
[facility] [address] [place], [place], [address] Phone: [phone]   Fax:  [phone]  Occupational Therapy Treatment  Patient Details  Name: Rachael Walker MRN: [id number] Date of Birth: 1960/08/30 Referring Provider (OT): Dr. Lorelei Pont   Encounter Date: 04/22/2021   OT End of Session - 04/22/21 1105     Visit Number 4    Number of Visits 11    Date for OT Re-Evaluation 05/21/21    Authorization Type cigna    OT Start Time 1104    OT Stop Time 1144    OT Time Calculation (min) 40 min             No past medical history on file.  Past Surgical History:  Procedure Laterality Date   BUNIONECTOMY Right    COLONOSCOPY  2009   w/[name]   COLONOSCOPY  10/24/2020   nandigam   COLONOSCOPY  2018    There were no vitals filed for this visit.   Subjective Assessment - 04/22/21 1105     Subjective  No pain, pt reports she saw orthopedic MD today    Pertinent History Pt s/p injury and colles fx L ulna and distal radius 12/24/20, underwent ORIF on7/22/22    Currently in Pain? No/denies              Treatment:Fluidotherapy to LUE x 10 mins with pt performing A/ROM while in fluido, no adverse reactions [place] 71mhz, 0.8 w/cm 2, 20% x 8 mins to volar digits, palm and thumb, no adverse reactions.  Prayer stretch Reviewed strengthening with 1 lbs weight for wrist flexion/ extension, and ulnar/ radial deviation, 10-15 reps each. Strengthening with gripper set at level 2 resistance to pick up 1 inch blocks, several rest breaks required due to fatigue Sustained pinch with graded clothespins yellow-blue, (approx 1-7 lbs resistance) Wrist winder x 4 reps min v.c with 1 lbs weight                        OT Long Term Goals - 04/22/21 1107       OT LONG TERM GOAL #1   Title I with HEP    Time 5    Period Weeks    Status On-going      OT LONG TERM GOAL #2   Title Pt will increase LUE grip  strength by 5 lbs for increased LUE functional use.    Baseline LUE 14.5, RUE 72.9    Time 5    Period Weeks    Status On-going      OT LONG TERM GOAL #3   Title Pt will demonstrate 55* wrist flexion/ extension for increased ease with ADLs.    Baseline wrist flexion/ extension:55/55    Time 5    Period Weeks    Status New      OT LONG TERM GOAL #4   Title Pt will report increased ease with brushing hair and pulling hair back into a ponytail.    Time 5    Period Weeks    Status On-going      OT LONG TERM GOAL #5   Title Pt will increase Thumb MP flexion to 45* for increased ease with ADLS.    Baseline thumb MP flexion 35*    Time 5    Period Weeks    Status On-going      OT LONG TERM GOAL #6   Title Pt will demonstrate ability to  perfrom modified weight bearing though left wrist with pain no greater than 2/10.    Time 5    Period Weeks    Status On-going                   Plan - 04/22/21 1106     Clinical Impression Statement Pt is progressing towards goals with improving ROM and strength.    OT Occupational Profile and History Problem Focused Assessment - Including review of records relating to presenting problem    Occupational performance deficits (Please refer to evaluation for details): ADL's;IADL's;Work;Leisure;Social Participation    Body Structure / Function / Physical Skills ADL;Strength;UE functional use;Flexibility;FMC;Pain;Coordination;GMC;ROM;Scar mobility;Decreased knowledge of use of DME;Dexterity;Sensation;IADL    Rehab Potential Good    Clinical Decision Making Limited treatment options, no task modification necessary    Comorbidities Affecting Occupational Performance: None    Modification or Assistance to Complete Evaluation  No modification of tasks or assist necessary to complete eval    OT Frequency 2x / week    OT Duration --   5 weeks plus eval   OT Treatment/Interventions Self-care/ADL training;Therapeutic  exercise;Ultrasound;Neuromuscular education;Manual Therapy;Therapeutic activities;Splinting;Cryotherapy;Paraffin;DME and/or AE instruction;Scar mobilization;Fluidtherapy;Electrical Stimulation;Moist Heat;Contrast Bath;Passive range of motion;Patient/family education    Plan paraffin, [place], reveiw putty, ROM and strengthening    Consulted and Agree with Plan of Care Patient             Patient will benefit from skilled therapeutic intervention in order to improve the following deficits and impairments:   Body Structure / Function / Physical Skills: ADL, Strength, UE functional use, Flexibility, FMC, Pain, Coordination, GMC, ROM, Scar mobility, Decreased knowledge of use of DME, Dexterity, Sensation, IADL       Visit Diagnosis: Stiffness of left wrist, not elsewhere classified  Stiffness of left hand, not elsewhere classified  Muscle weakness (generalized)    Problem List Patient Active Problem List   Diagnosis Date Noted   Rupture of medial head of gastrocnemius, left, subsequent encounter 01/02/2020   Posterior pain of right hip 12/10/2016   BUNION, RIGHT FOOT 04/23/2010   ABNORMALITY OF GAIT 04/23/2010   UNEQUAL LEG LENGTH 03/31/2010   PLANTAR FASCIITIS, BILATERAL 03/13/2010   STRESS FRACTURE, FOOT 03/13/2010    Dionte Blaustein, OT/L 04/22/2021, 11:39 AM  [facility] [address] [place], [place], [address] Phone: [phone]   Fax:  [phone]  Name: Rachael Walker MRN: [id number] Date of Birth: 15-Jul-1960

## 2021-04-23 ENCOUNTER — Ambulatory Visit: Payer: Managed Care, Other (non HMO) | Admitting: Gastroenterology

## 2021-04-25 ENCOUNTER — Encounter: Payer: Self-pay | Admitting: Family Medicine

## 2021-04-25 DIAGNOSIS — M62549 Muscle wasting and atrophy, not elsewhere classified, unspecified hand: Secondary | ICD-10-CM

## 2021-04-29 ENCOUNTER — Ambulatory Visit: Payer: Managed Care, Other (non HMO) | Admitting: Occupational Therapy

## 2021-04-29 ENCOUNTER — Other Ambulatory Visit: Payer: Self-pay

## 2021-04-29 DIAGNOSIS — M25632 Stiffness of left wrist, not elsewhere classified: Secondary | ICD-10-CM

## 2021-04-29 DIAGNOSIS — R208 Other disturbances of skin sensation: Secondary | ICD-10-CM

## 2021-04-29 DIAGNOSIS — M6281 Muscle weakness (generalized): Secondary | ICD-10-CM

## 2021-04-29 DIAGNOSIS — M25642 Stiffness of left hand, not elsewhere classified: Secondary | ICD-10-CM

## 2021-04-29 NOTE — Therapy (Signed)
[facility] [address] [place], [place], [address] Phone: [phone]   Fax:  [phone]  Occupational Therapy Treatment  Patient Details  Name: Rachael Walker MRN: [id number] Date of Birth: 1961-01-22 Referring Provider (OT): Dr. Lorelei Pont   Encounter Date: 04/29/2021   OT End of Session - 04/29/21 1145     Visit Number 5    Number of Visits 11    Date for OT Re-Evaluation 05/21/21    Authorization Type cigna    OT Start Time 0848    OT Stop Time 0928    OT Time Calculation (min) 40 min    Activity Tolerance Patient tolerated treatment well    Behavior During Therapy [facility] for tasks assessed/performed             No past medical history on file.  Past Surgical History:  Procedure Laterality Date   BUNIONECTOMY Right    COLONOSCOPY  2009   w/[name]   COLONOSCOPY  10/24/2020   nandigam   COLONOSCOPY  2018    There were no vitals filed for this visit.   Subjective Assessment - 04/29/21 1145     Subjective  Pt reprots she sees neurology tomorrow    Pertinent History Pt s/p injury and colles fx L ulna and distal radius 12/24/20, underwent ORIF on7/22/22    Patient Stated Goals improve LUE wrist and hand function    Currently in Pain? No/denies               Pain: Pt denies pain, reports her hand is improving     Treatment:Fluidotherapy to LUE x 10 mins with pt performing A/ROM while in fluido, no adverse reactions Prayer stretch Standing at table rocking forwards and backwards to weight bear through LUE for wrist extension stretch Reviewed strengthening with 2 lbs weight for wrist flexion/ extension, and ulnar/ radial deviation, 10-15 reps each.  Passive stretch in wrist extension with 3 lbs weight  Strengthening with gripper set at level 2 resistance to pick up 1 inch blocks, several rest breaks required due to fatigue Sustained pinch with graded clothespins yellow-black , (approx 1- 8  lbs resistance) Wrist winder x 3 reps each direction min v.c with 1 lbs weight Forearm gym x 3 reps each direction for increased ROM                   OT Long Term Goals - 04/22/21 1107       OT LONG TERM GOAL #1   Title I with HEP    Time 5    Period Weeks    Status On-going      OT LONG TERM GOAL #2   Title Pt will increase LUE grip strength by 5 lbs for increased LUE functional use.    Baseline LUE 14.5, RUE 72.9    Time 5    Period Weeks    Status On-going      OT LONG TERM GOAL #3   Title Pt will demonstrate 55* wrist flexion/ extension for increased ease with ADLs.    Baseline wrist flexion/ extension:55/55    Time 5    Period Weeks    Status New      OT LONG TERM GOAL #4   Title Pt will report increased ease with brushing hair and pulling hair back into a ponytail.    Time 5    Period Weeks    Status On-going      OT LONG TERM  GOAL #5   Title Pt will increase Thumb MP flexion to 45* for increased ease with ADLS.    Baseline thumb MP flexion 35*    Time 5    Period Weeks    Status On-going      OT LONG TERM GOAL #6   Title Pt will demonstrate ability to perfrom modified weight bearing though left wrist with pain no greater than 2/10.    Time 5    Period Weeks    Status On-going                   Plan - 04/29/21 0906     Clinical Impression Statement Pt is progressing towards goals with improving ROM and strength. She reports that she was able to perform a plank with increased ease .    OT Occupational Profile and History Problem Focused Assessment - Including review of records relating to presenting problem    Occupational performance deficits (Please refer to evaluation for details): ADL's;IADL's;Work;Leisure;Social Participation    Body Structure / Function / Physical Skills ADL;Strength;UE functional use;Flexibility;FMC;Pain;Coordination;GMC;ROM;Scar mobility;Decreased knowledge of use of DME;Dexterity;Sensation;IADL    Rehab  Potential Good    Clinical Decision Making Limited treatment options, no task modification necessary    Comorbidities Affecting Occupational Performance: None    Modification or Assistance to Complete Evaluation  No modification of tasks or assist necessary to complete eval    OT Frequency 2x / week    OT Duration --   5 weeks plus eval   OT Treatment/Interventions Self-care/ADL training;Therapeutic exercise;Ultrasound;Neuromuscular education;Manual Therapy;Therapeutic activities;Splinting;Cryotherapy;Paraffin;DME and/or AE instruction;Scar mobilization;Fluidtherapy;Electrical Stimulation;Moist Heat;Contrast Bath;Passive range of motion;Patient/family education    Plan Fluidotherapy, [place], reveiw putty, ROM and strengthening    Consulted and Agree with Plan of Care Patient             Patient will benefit from skilled therapeutic intervention in order to improve the following deficits and impairments:   Body Structure / Function / Physical Skills: ADL, Strength, UE functional use, Flexibility, FMC, Pain, Coordination, GMC, ROM, Scar mobility, Decreased knowledge of use of DME, Dexterity, Sensation, IADL       Visit Diagnosis: Stiffness of left wrist, not elsewhere classified  Stiffness of left hand, not elsewhere classified  Muscle weakness (generalized)  Other disturbances of skin sensation    Problem List Patient Active Problem List   Diagnosis Date Noted   Rupture of medial head of gastrocnemius, left, subsequent encounter 01/02/2020   Posterior pain of right hip 12/10/2016   BUNION, RIGHT FOOT 04/23/2010   ABNORMALITY OF GAIT 04/23/2010   UNEQUAL LEG LENGTH 03/31/2010   PLANTAR FASCIITIS, BILATERAL 03/13/2010   STRESS FRACTURE, FOOT 03/13/2010    Skilar Marcou, OT/L 04/29/2021, 11:57 AM [name], OTR/L [phone] Phone: [phone] 11:58 AM 04/29/21  [facility] [address] [place], [place], [address] Phone: [phone]   Fax:  [phone]  Name: Rachael Walker MRN: [id number] Date of Birth: Jul 15, 1960

## 2021-05-01 ENCOUNTER — Ambulatory Visit: Payer: Managed Care, Other (non HMO) | Admitting: Occupational Therapy

## 2021-05-06 ENCOUNTER — Other Ambulatory Visit: Payer: Self-pay

## 2021-05-06 ENCOUNTER — Ambulatory Visit: Payer: Managed Care, Other (non HMO) | Admitting: Occupational Therapy

## 2021-05-06 DIAGNOSIS — M25632 Stiffness of left wrist, not elsewhere classified: Secondary | ICD-10-CM

## 2021-05-06 DIAGNOSIS — R208 Other disturbances of skin sensation: Secondary | ICD-10-CM

## 2021-05-06 DIAGNOSIS — M25642 Stiffness of left hand, not elsewhere classified: Secondary | ICD-10-CM

## 2021-05-06 DIAGNOSIS — M6281 Muscle weakness (generalized): Secondary | ICD-10-CM

## 2021-05-06 NOTE — Therapy (Signed)
[facility] [address] [place], [place], [address] Phone: [phone]   Fax:  [phone]  Occupational Therapy Treatment  Patient Details  Name: Rachael Walker MRN: [id number] Date of Birth: [birth year] Referring Provider (OT): Dr. Lorelei Pont   Encounter Date: 05/06/2021   OT End of Session - 05/06/21 0812     Visit Number 6    Number of Visits 11    Date for OT Re-Evaluation 05/21/21    Authorization Type cigna    OT Start Time 0809    OT Stop Time 0840    OT Time Calculation (min) 31 min             No past medical history on file.  Past Surgical History:  Procedure Laterality Date   BUNIONECTOMY Right    COLONOSCOPY  2009   w/[name]   COLONOSCOPY  10/24/2020   nandigam   COLONOSCOPY  2018    There were no vitals filed for this visit.   Subjective Assessment - 05/06/21 0811     Subjective  Pt saw neurology for inital consult    Pertinent History Pt s/p injury and colles fx L ulna and distal radius 12/24/20, underwent ORIF on7/22/22    Patient Stated Goals improve LUE wrist and hand function    Currently in Pain? No/denies                    Treatment:Fluidotherapy to LUE x 9 mins with pt performing A/ROM while in fluido, no adverse reactions Prayer stretch Standing at table rocking forwards and backwards to weight bear through LUE for wrist extension stretch Reviewed strengthening with 2 lbs weight for wrist flexion/ extension, and ulnar/ radial deviation, 10-15 reps each.  Upgraded putty to green for sustained grip, pt demonstrates understanding. Sustained pinch with graded clothespins yellow-black , (approx 1- 8 lbs resistance) Gripper set at level 2 for sustained grip to pick up 1 inch blocks, min difficulty/ drops Wrist winder x 3 reps each direction min v.c with 1 lbs weight                        OT Long Term Goals - 04/22/21 1107       OT LONG TERM  GOAL #1   Title I with HEP    Time 5    Period Weeks    Status On-going      OT LONG TERM GOAL #2   Title Pt will increase LUE grip strength by 5 lbs for increased LUE functional use.    Baseline LUE 14.5, RUE 72.9    Time 5    Period Weeks    Status On-going      OT LONG TERM GOAL #3   Title Pt will demonstrate 55* wrist flexion/ extension for increased ease with ADLs.    Baseline wrist flexion/ extension:55/55    Time 5    Period Weeks    Status New      OT LONG TERM GOAL #4   Title Pt will report increased ease with brushing hair and pulling hair back into a ponytail.    Time 5    Period Weeks    Status On-going      OT LONG TERM GOAL #5   Title Pt will increase Thumb MP flexion to 45* for increased ease with ADLS.    Baseline thumb MP flexion 35*    Time 5    Period Weeks  Status On-going      OT LONG TERM GOAL #6   Title Pt will demonstrate ability to perfrom modified weight bearing though left wrist with pain no greater than 2/10.    Time 5    Period Weeks    Status On-going                   Plan - 05/06/21 0849     Clinical Impression Statement Pt is progressing towards goals with improving ROM and strength. She reports that she  is using her LUE more frequently during ADLS.    OT Occupational Profile and History Problem Focused Assessment - Including review of records relating to presenting problem    Occupational performance deficits (Please refer to evaluation for details): ADL's;IADL's;Work;Leisure;Social Participation    Body Structure / Function / Physical Skills ADL;Strength;UE functional use;Flexibility;FMC;Pain;Coordination;GMC;ROM;Scar mobility;Decreased knowledge of use of DME;Dexterity;Sensation;IADL    Rehab Potential Good    Clinical Decision Making Limited treatment options, no task modification necessary    Comorbidities Affecting Occupational Performance: None    Modification or Assistance to Complete Evaluation  No modification of  tasks or assist necessary to complete eval    OT Frequency 2x / week    OT Duration --   5 weeks plus eval   OT Treatment/Interventions Self-care/ADL training;Therapeutic exercise;Ultrasound;Neuromuscular education;Manual Therapy;Therapeutic activities;Splinting;Cryotherapy;Paraffin;DME and/or AE instruction;Scar mobilization;Fluidtherapy;Electrical Stimulation;Moist Heat;Contrast Bath;Passive range of motion;Patient/family education    Plan Check progress towards goals, anticipate d/c next week,Fluidotherapy, [place], reveiw putty, ROM and strengthening    Consulted and Agree with Plan of Care Patient             Patient will benefit from skilled therapeutic intervention in order to improve the following deficits and impairments:   Body Structure / Function / Physical Skills: ADL, Strength, UE functional use, Flexibility, FMC, Pain, Coordination, GMC, ROM, Scar mobility, Decreased knowledge of use of DME, Dexterity, Sensation, IADL       Visit Diagnosis: Stiffness of left wrist, not elsewhere classified  Stiffness of left hand, not elsewhere classified  Muscle weakness (generalized)  Other disturbances of skin sensation    Problem List Patient Active Problem List   Diagnosis Date Noted   Rupture of medial head of gastrocnemius, left, subsequent encounter 01/02/2020   Posterior pain of right hip 12/10/2016   BUNION, RIGHT FOOT 04/23/2010   ABNORMALITY OF GAIT 04/23/2010   UNEQUAL LEG LENGTH 03/31/2010   PLANTAR FASCIITIS, BILATERAL 03/13/2010   STRESS FRACTURE, FOOT 03/13/2010    Teyana Pierron, OT/L 05/06/2021, 8:55 AM  [facility] [address] [place], [place], [address] Phone: [phone]   Fax:  [phone]  Name: LANESSA SHILL MRN: [id number] Date of Birth: [birth year]

## 2021-05-13 ENCOUNTER — Ambulatory Visit: Payer: Managed Care, Other (non HMO) | Admitting: Occupational Therapy

## 2021-05-15 ENCOUNTER — Encounter: Payer: Managed Care, Other (non HMO) | Admitting: Occupational Therapy

## 2021-05-15 ENCOUNTER — Encounter: Payer: Self-pay | Admitting: Gastroenterology

## 2021-05-15 ENCOUNTER — Ambulatory Visit (INDEPENDENT_AMBULATORY_CARE_PROVIDER_SITE_OTHER): Payer: Managed Care, Other (non HMO) | Admitting: Gastroenterology

## 2021-05-15 VITALS — BP 100/64 | HR 64 | Ht 67.25 in | Wt 139.2 lb

## 2021-05-15 DIAGNOSIS — M62542 Muscle wasting and atrophy, not elsewhere classified, left hand: Secondary | ICD-10-CM | POA: Diagnosis not present

## 2021-05-15 DIAGNOSIS — E559 Vitamin D deficiency, unspecified: Secondary | ICD-10-CM | POA: Diagnosis not present

## 2021-05-15 DIAGNOSIS — K5190 Ulcerative colitis, unspecified, without complications: Secondary | ICD-10-CM

## 2021-05-15 MED ORDER — MESALAMINE 1.2 G PO TBEC: DELAYED_RELEASE_TABLET | ORAL | 3 refills | Status: DC

## 2021-05-15 NOTE — Addendum Note (Signed)
Addended by: Oda Kilts on: 05/15/2021 02:16 PM   Modules accepted: Orders

## 2021-05-15 NOTE — Patient Instructions (Signed)
Continue Lialda we will send in refills for you today  Start Vitamin D 800IU daily   Follow up in 3 months  If you are age 60 or older, your body mass index should be between 23-30. Your Body mass index is 21.65 kg/m. If this is out of the aforementioned range listed, please consider follow up with your Primary Care Provider.  If you are age 82 or younger, your body mass index should be between 19-25. Your Body mass index is 21.65 kg/m. If this is out of the aformentioned range listed, please consider follow up with your Primary Care Provider.   ________________________________________________________  The [facility] providers would like to encourage you to use [facility] to communicate with providers for non-urgent requests or questions.  Due to long hold times on the telephone, sending your provider a message by [facility] may be a faster and more efficient way to get a response.  Please allow 48 business hours for a response.  Please remember that this is for non-urgent requests.  _______________________________________________________   I appreciate the  opportunity to care for you  Thank You   Harl Bowie , MD

## 2021-05-15 NOTE — Progress Notes (Signed)
Rachael Walker    [id number]    Oct 21, 1960  Primary Care Physician:Copland, Gay Filler, MD  Referring Physician: Darreld Mclean, MD [address] [place],  [place] [address]   Chief complaint: Ulcerative colitis  HPI:  60 year old very pleasant female here for follow-up visit for ulcerative colitis . Overall she feels symptoms from ulcerative colitis are stable.  She completed 90-day course of budesonide to improve symptoms from an acute flare this past summer. She is taking Lialda 4 tablets daily Denies any nausea, vomiting, abdominal pain, melena or bright red blood per rectum   She had left wrist fracture in July 2022, was in a cast for few weeks prior to surgery and she has developed weakness and atrophy in the left hand  Colonoscopy Oct 24, 2020 - Preparation of the colon was inadequate. - One 5 mm polyp in the ascending colon, removed with a cold snare. Resected and retrieved. - Patchy mild inflammation was found in the rectum, in the sigmoid colon and in the descending colon secondary to colitis. Biopsied. - The examined portion of the ileum was normal. - Non-bleeding external and internal hemorrhoids.  1. Surgical [P], colon, ascending, polyp (1) - SESSILE SERRATED POLYP WITHOUT CYTOLOGIC DYSPLASIA. 2. Surgical [P], right colon - UNREMARKABLE COLONIC MUCOSA. - NO ACTIVE INFLAMMATION OR GRANULOMAS. - NEGATIVE FOR DYSPLASIA. 3. Surgical [P], left colon - MODERATELY ACTIVE CHRONIC COLITIS CONSISTENT WITH INFLAMMATORY BOWEL DISEASE. - NO GRANULOMAS IDENTIFIED. - NEGATIVE FOR DYSPLASIA. 4. Surgical [P], colon, rectum - MODERATELY ACTIVE CHRONIC COLITIS CONSISTENT WITH INFLAMMATORY BOWEL DISEASE. - NO GRANULOMAS IDENTIFIED. - NEGATIVE FOR DYSPLASIA.  Outpatient Encounter Medications as of 05/15/2021  Medication Sig   estradiol (ESTRACE) 2 MG tablet estradiol 2 mg tablet  TAKE 1 TABLET BY MOUTH EVERY DAY   mesalamine (LIALDA) 1.2 g EC  tablet Take 2 tablets (2.4 g total) by mouth daily with breakfast for 30 days, THEN 4 tablets (4.8 g total) daily with breakfast.   progesterone (PROMETRIUM) 100 MG capsule progesterone micronized 100 mg capsule   PROLIA 60 MG/ML SOSY injection Inject 60 mg into the skin every 6 (six) months.   [DISCONTINUED] budesonide (ENTOCORT EC) 3 MG 24 hr capsule TAKE 3 CAPSULES (9 MG TOTAL) BY MOUTH DAILY.   [DISCONTINUED] oxyCODONE-acetaminophen (PERCOCET/ROXICET) 5-325 MG tablet Take 1 tablet by mouth every 6 (six) hours as needed for severe pain.   [DISCONTINUED] senna-docusate (SENOKOT-S) 8.6-50 MG tablet Take 1 tablet by mouth at bedtime as needed for mild constipation or moderate constipation.   No facility-administered encounter medications on file as of 05/15/2021.    Allergies as of 05/15/2021 - Review Complete 05/15/2021  Allergen Reaction Noted   Nsaids  04/30/2021    Past Medical History:  Diagnosis Date   UC (ulcerative colitis) [facility])     Past Surgical History:  Procedure Laterality Date   BUNIONECTOMY Right    COLONOSCOPY  2009   w/[name]   COLONOSCOPY  10/24/2020   Shanea Karney   COLONOSCOPY  2018   WRIST FRACTURE SURGERY Left 01/03/2021    Family History  Problem Relation Age of Onset   Osteoporosis Mother    Colon polyps Mother    Cancer Father    Diabetes Father    Hyperlipidemia Father    Colon polyps Brother    Cancer Maternal Grandmother        kidney   Heart disease Maternal Grandfather    Diabetes  Paternal Grandmother    Diabetes Brother    Colon cancer Neg Hx     Social History   Socioeconomic History   Marital status: Married    Spouse name: Not on file   Number of children: 3   Years of education: Not on file   Highest education level: Not on file  Occupational History   Not on file  Tobacco Use   Smoking status: Never   Smokeless tobacco: Never  Vaping Use   Vaping Use: Never used  Substance and Sexual Activity   Alcohol use: Yes    Comment:  occ. -2 times a month per pt   Drug use: No   Sexual activity: Not on file  Other Topics Concern   Not on file  Social History Narrative   Not on file   Social Determinants of Health   Financial Resource Strain: Not on file  Food Insecurity: Not on file  Transportation Needs: Not on file  Physical Activity: Not on file  Stress: Not on file  Social Connections: Not on file  Intimate Partner Violence: Not on file      Review of systems: All other review of systems negative except as mentioned in the HPI.   Physical Exam: Vitals:   05/15/21 0811  BP: 100/64  Pulse: 64   Body mass index is 21.65 kg/m. Gen:      No acute distress HEENT:  sclera anicteric Abd:      soft, non-tender; no palpable masses, no distension Ext:    No edema Neuro: alert and oriented x 3 Psych: normal mood and affect  Data Reviewed:  Reviewed labs, radiology imaging, old records and pertinent past GI work up   Assessment and Plan/Recommendations:  60 year old very pleasant female recently diagnosed with ulcerative colitis during an acute flare in May 2022, improved with budesonide and is currently on maintenance dose of Lialda with no clinical symptoms Continue Lialda 4.8 g daily  Intermittently deficiency: Start vitamin D 800 international units daily Repeat vitamin D level in April 2023  Left hand weakness and atrophy s/p left wrist fracture: We will refer to Dr. Gardenia Phlegm for evaluation and management.  She has follow-up visit with neurology  For surveillance colonoscopy May 2023 to check mucosal healing and also bowel prep was suboptimal on last exam for colorectal cancer screening and polyp detection  Return in 3 months   The patient was provided an opportunity to ask questions and all were answered. The patient agreed with the plan and demonstrated an understanding of the instructions.  Damaris Hippo , MD    CC: Copland, Gay Filler, MD

## 2021-05-16 ENCOUNTER — Other Ambulatory Visit: Payer: Self-pay

## 2021-05-16 ENCOUNTER — Ambulatory Visit: Payer: Managed Care, Other (non HMO) | Attending: Family Medicine | Admitting: Occupational Therapy

## 2021-05-16 DIAGNOSIS — M25642 Stiffness of left hand, not elsewhere classified: Secondary | ICD-10-CM | POA: Diagnosis present

## 2021-05-16 DIAGNOSIS — M25632 Stiffness of left wrist, not elsewhere classified: Secondary | ICD-10-CM | POA: Diagnosis present

## 2021-05-16 DIAGNOSIS — M6281 Muscle weakness (generalized): Secondary | ICD-10-CM | POA: Insufficient documentation

## 2021-05-16 NOTE — Patient Instructions (Signed)
Finger / Thumb Extensors    Place right fingers and thumb in center of putty donut, stretch out. Can use rubber bandPinch: Lateral Repeat 10____ times. Do __2__ sessions per day.    Squeeze putty between right thumb and side of each finger in turn. Make small balls and squash into coin shapes. Repeat __10__ times. Do __2__ sessions per day. Activity: Hold dish of food.* Turn key in lock. Deal cards.  Copyright  VHI. All rights reserved.     Copyright  VHI. All rights reserved.

## 2021-05-16 NOTE — Therapy (Addendum)
[facility] [address] [place], [place], [address] Phone: [phone]   Fax:  [phone]  Occupational Therapy Treatment  Patient Details  Name: Rachael Walker MRN: [id number] Date of Birth: July 06, 1960 Referring Provider (OT): Dr. Lorelei Pont   Encounter Date: 05/16/2021   OT End of Session - 05/16/21 0840     Visit Number 7    Number of Visits 11    Date for OT Re-Evaluation --    Authorization Type cigna    OT Start Time 0803    OT Stop Time 1324    OT Time Calculation (min) 32 min    Activity Tolerance Patient tolerated treatment well    Behavior During Therapy [facility] for tasks assessed/performed             Past Medical History:  Diagnosis Date   UC (ulcerative colitis) [facility])     Past Surgical History:  Procedure Laterality Date   BUNIONECTOMY Right    COLONOSCOPY  2009   w/[name]   COLONOSCOPY  10/24/2020   nandigam   COLONOSCOPY  2018   WRIST FRACTURE SURGERY Left 01/03/2021    There were no vitals filed for this visit.   Subjective Assessment - 05/16/21 0806     Subjective  Pt reports she is able to do modified plank    Pertinent History Pt s/p injury and colles fx L ulna and distal radius 12/24/20, underwent ORIF on7/22/22    Patient Stated Goals improve LUE wrist and hand function    Currently in Pain? No/denies                   Treatment: Fluidotherapy x 9 mins for stiffness, no adverse reactions(pink coloration initially however it dissipated throughout session) Passive wrist flexion/ extension, then weightbearing through tabletop rocking forwards and backwards for stretch in wrist extension. Functional reaching to place and remove graded clothespins from vertical antennae 1-8 # for sustained pinch, lateral and 3 pt. Passive stretch to thumb MP joint in flexion. Therapist checked progress towards goals. Pt has achieved all goals. She demonstrates improved ROM. Wrist flexion/  extension: 60/60 following stretch, Thumb MP flexion :45 following stretch               OT Education - 05/16/21 0839     Education Details putty exercises/ rubberband exercise( pt simulated motion even though she did no have her putty with her today )   Person(s) Educated Patient    Methods Explanation;Demonstration;Verbal cues;Handout    Comprehension Verbal cues required;Verbalized understanding;Returned demonstration                 OT Long Term Goals - 05/16/21 4010       OT LONG TERM GOAL #1   Title I with HEP    Time 5    Period Weeks    Status Achieved      OT LONG TERM GOAL #2   Title Pt will increase LUE grip strength by 5 lbs for increased LUE functional use.    Baseline LUE 14.5, RUE 72.9    Time 5    Period Weeks    Status Achieved   33.5     OT LONG TERM GOAL #3   Title Pt will demonstrate 55* wrist flexion/ extension for increased ease with ADLs.    Baseline --    Time 5    Period Weeks    Status Achieved   60/60     OT LONG  TERM GOAL #4   Title Pt will report increased ease with brushing hair and pulling hair back into a ponytail.    Time 5    Period Weeks    Status Achieved      OT LONG TERM GOAL #5   Title Pt will increase Thumb MP flexion to 45* for increased ease with ADLS.    Baseline thumb MP flexion 35*    Time 5    Period Weeks    Status Achieved   45     OT LONG TERM GOAL #6   Title Pt will demonstrate ability to perfrom modified weight bearing though left wrist with pain no greater than 2/10.    Time 5    Period Weeks    Status Achieved                   Plan - 05/16/21 1610     Clinical Impression Statement Pt demonstrates good overall progress. She has met all goals.She is seeing neurology in January. She  would like to place therapy on hold, in case neurology recommends additional therapy.    OT Occupational Profile and History Problem Focused Assessment - Including review of records relating to  presenting problem    Occupational performance deficits (Please refer to evaluation for details): ADL's;IADL's;Work;Leisure;Social Participation    Body Structure / Function / Physical Skills ADL;Strength;UE functional use;Flexibility;FMC;Pain;Coordination;GMC;ROM;Scar mobility;Decreased knowledge of use of DME;Dexterity;Sensation;IADL    Rehab Potential Good    Clinical Decision Making Limited treatment options, no task modification necessary    Comorbidities Affecting Occupational Performance: None    Modification or Assistance to Complete Evaluation  No modification of tasks or assist necessary to complete eval    OT Frequency 2x / week    OT Duration --   5 weeks plus eval   OT Treatment/Interventions Self-care/ADL training;Therapeutic exercise;Ultrasound;Neuromuscular education;Manual Therapy;Therapeutic activities;Splinting;Cryotherapy;Paraffin;DME and/or AE instruction;Scar mobilization;Fluidtherapy;Electrical Stimulation;Moist Heat;Contrast Bath;Passive range of motion;Patient/family education    Plan  place therapy on hold until after pt sees neurology, will  d/c if pt does not return to therapy   Consulted and Agree with Plan of Care Patient             Patient will benefit from skilled therapeutic intervention in order to improve the following deficits and impairments:   Body Structure / Function / Physical Skills: ADL, Strength, UE functional use, Flexibility, FMC, Pain, Coordination, GMC, ROM, Scar mobility, Decreased knowledge of use of DME, Dexterity, Sensation, IADL       Visit Diagnosis: Stiffness of left wrist, not elsewhere classified  Stiffness of left hand, not elsewhere classified  Muscle weakness (generalized)    Problem List Patient Active Problem List   Diagnosis Date Noted   Rupture of medial head of gastrocnemius, left, subsequent encounter 01/02/2020   Posterior pain of right hip 12/10/2016   BUNION, RIGHT FOOT 04/23/2010   ABNORMALITY OF GAIT  04/23/2010   UNEQUAL LEG LENGTH 03/31/2010   PLANTAR FASCIITIS, BILATERAL 03/13/2010   STRESS FRACTURE, FOOT 03/13/2010    Rachael Walker, OT 05/16/2021, 9:03 AM [name], OTR/L [phone] Phone: [phone] 9:03 AM 05/16/21  [facility] [address] [place], [place], [address] Phone: [phone]   Fax:  [phone]  Name: Rachael Walker MRN: [id number] Date of Birth: 1960-08-16

## 2021-05-28 ENCOUNTER — Ambulatory Visit: Payer: BLUE CROSS/BLUE SHIELD | Admitting: Diagnostic Neuroimaging

## 2021-06-13 ENCOUNTER — Other Ambulatory Visit: Payer: Self-pay | Admitting: Physician Assistant

## 2021-06-25 ENCOUNTER — Encounter: Payer: Self-pay | Admitting: *Deleted

## 2021-07-01 ENCOUNTER — Other Ambulatory Visit: Payer: Self-pay

## 2021-07-01 ENCOUNTER — Ambulatory Visit (INDEPENDENT_AMBULATORY_CARE_PROVIDER_SITE_OTHER): Payer: Managed Care, Other (non HMO) | Admitting: Diagnostic Neuroimaging

## 2021-07-01 ENCOUNTER — Encounter: Payer: Self-pay | Admitting: Diagnostic Neuroimaging

## 2021-07-01 VITALS — BP 118/86 | HR 50 | Ht 67.5 in

## 2021-07-01 DIAGNOSIS — G5622 Lesion of ulnar nerve, left upper limb: Secondary | ICD-10-CM | POA: Diagnosis not present

## 2021-07-01 NOTE — Progress Notes (Signed)
[facility]  PATIENT: Rachael Walker DOB: 06/05/1961  REFERRING CLINICIAN: Milly Jakob, MD HISTORY FROM: patient  REASON FOR VISIT: new consult    HISTORICAL  CHIEF COMPLAINT:  Chief Complaint  Patient presents with   New Patient (Initial Visit)    RM 6 alone here for consult on left hand weakness/muscle atrophy. Pt reports- she broke her left wrist and sx started after this event.     HISTORY OF PRESENT ILLNESS:   61 year old female here for evaluation of left hand muscle atrophy.  July 2022 patient was exercising, slipped and fell backwards landing on her left hand.  She had immediate pop and pain sensation.  She went to the emergency room for evaluation was diagnosed with a left wrist fracture.  This was treated with a cast initially.  10 days later she went open reduction internal fixation with hardware and subsequent casting.  Following this when she had her cast removed significant intrinsic muscle atrophy was noted in the left hand.  Since that time patient has started to significantly improve and muscle atrophy and strength.  No pain.  Very minimal numbness in her left fifth digit.   REVIEW OF SYSTEMS: Full 14 system review of systems performed and negative with exception of: as per HPI.  ALLERGIES: Allergies  Allergen Reactions   Nsaids     Other reaction(s): Other (See Comments) Ulcerative colitis    HOME MEDICATIONS: Outpatient Medications Prior to Visit  Medication Sig Dispense Refill   estradiol (ESTRACE) 2 MG tablet estradiol 2 mg tablet  TAKE 1 TABLET BY MOUTH EVERY DAY     mesalamine (LIALDA) 1.2 g EC tablet Take 2 tablets (2.4 g total) by mouth daily with breakfast for 30 days, THEN 4 tablets (4.8 g total) daily with breakfast. 180 tablet 3   progesterone (PROMETRIUM) 100 MG capsule progesterone micronized 100 mg capsule     PROLIA 60 MG/ML SOSY injection Inject 60 mg into the skin every 6 (six) months.     No  facility-administered medications prior to visit.    PAST MEDICAL HISTORY: Past Medical History:  Diagnosis Date   Left hand weakness    Muscle atrophy    UC (ulcerative colitis) ([place])     PAST SURGICAL HISTORY: Past Surgical History:  Procedure Laterality Date   BUNIONECTOMY Right    COLONOSCOPY  2009   w/[name]   COLONOSCOPY  10/24/2020   nandigam   COLONOSCOPY  2018   WRIST FRACTURE SURGERY Left 01/03/2021    FAMILY HISTORY: Family History  Problem Relation Age of Onset   Osteoporosis Mother    Colon polyps Mother    Cancer Father    Diabetes Father    Hyperlipidemia Father    Colon polyps Brother    Cancer Maternal Grandmother        kidney   Heart disease Maternal Grandfather    Diabetes Paternal Grandmother    Diabetes Brother    Colon cancer Neg Hx     SOCIAL HISTORY: Social History   Socioeconomic History   Marital status: Married    Spouse name: Not on file   Number of children: 3   Years of education: Not on file   Highest education level: Bachelor's degree (e.g., BA, AB, BS)  Occupational History   Not on file  Tobacco Use   Smoking status: Never   Smokeless tobacco: Never  Vaping Use   Vaping Use: Never used  Substance and Sexual Activity   Alcohol use:  Yes    Comment: occ. -2 times a month per pt   Drug use: No   Sexual activity: Not on file  Other Topics Concern   Not on file  Social History Narrative   Lives with husband   Right handed    Caffeine- 3 cups per day     Social Determinants of Health   Financial Resource Strain: Not on file  Food Insecurity: Not on file  Transportation Needs: Not on file  Physical Activity: Not on file  Stress: Not on file  Social Connections: Not on file  Intimate Partner Violence: Not on file     PHYSICAL EXAM  GENERAL EXAM/CONSTITUTIONAL: Vitals:  Vitals:   07/01/21 1315  BP: 118/86  Pulse: (!) 50  SpO2: 97%  Height: 5' 7.5" (1.715 m)   Body mass index is 21.49 kg/m. Wt  Readings from Last 3 Encounters:  05/15/21 139 lb 4 oz (63.2 kg)  12/24/20 150 lb (68 kg)  11/19/20 150 lb 6 oz (68.2 kg)   Patient is in no distress; well developed, nourished and groomed; neck is supple  CARDIOVASCULAR: Examination of carotid arteries is normal; no carotid bruits Regular rate and rhythm, no murmurs Examination of peripheral vascular system by observation and palpation is normal  EYES: Ophthalmoscopic exam of optic discs and posterior segments is normal; no papilledema or hemorrhages No results found.  MUSCULOSKELETAL: Gait, strength, tone, movements noted in Neurologic exam below  NEUROLOGIC: MENTAL STATUS:  No flowsheet data found. awake, alert, oriented to person, place and time recent and remote memory intact normal attention and concentration language fluent, comprehension intact, naming intact fund of knowledge appropriate  CRANIAL NERVE:  2nd - no papilledema on fundoscopic exam 2nd, 3rd, 4th, 6th - pupils equal and reactive to light, visual fields full to confrontation, extraocular muscles intact, no nystagmus 5th - facial sensation symmetric 7th - facial strength symmetric 8th - hearing intact 9th - palate elevates symmetrically, uvula midline 11th - shoulder shrug symmetric 12th - tongue protrusion midline  MOTOR:  normal bulk and tone, full strength in the BUE, BLE; EXCEPT MILD ATROPHY AND WEAKNESS (4/5) OF FDI, ADM  SENSORY:  normal and symmetric to light touch, temperature, vibration; EXCEPT MILD DECR PP IN DIGIT 5 DISTALLY  COORDINATION:  finger-nose-finger, fine finger movements normal  REFLEXES:  deep tendon reflexes 1+ and symmetric  GAIT/STATION:  narrow based gait     DIAGNOSTIC DATA (LABS, IMAGING, TESTING) - I reviewed patient records, labs, notes, testing and imaging myself where available.  Lab Results  Component Value Date   WBC 4.1 02/19/2021   HGB 12.8 02/19/2021   HCT 38.4 02/19/2021   MCV 90.0 02/19/2021    PLT 169.0 02/19/2021      Component Value Date/Time   NA 140 02/19/2021 0925   K 4.1 02/19/2021 0925   CL 105 02/19/2021 0925   CO2 29 02/19/2021 0925   GLUCOSE 88 02/19/2021 0925   BUN 12 02/19/2021 0925   CREATININE 0.76 02/19/2021 0925   CREATININE 0.85 09/26/2020 1623   CALCIUM 8.8 02/19/2021 0925   PROT 6.6 02/19/2021 0925   ALBUMIN 3.8 02/19/2021 0925   AST 26 02/19/2021 0925   ALT 14 02/19/2021 0925   ALKPHOS 58 02/19/2021 0925   BILITOT 0.4 02/19/2021 0925   GFRNONAA >60 06/08/2017 1621   GFRAA >60 06/08/2017 1621   Lab Results  Component Value Date   CHOL 179 09/26/2020   HDL 66 09/26/2020   [place] 95 09/26/2020  TRIG 85 09/26/2020   CHOLHDL 2.7 09/26/2020   Lab Results  Component Value Date   HGBA1C 5.7 (H) 09/26/2020   No results found for: [id number] Lab Results  Component Value Date   TSH 2.90 09/26/2020    12/24/20 left wrist xray 1. Impacted, comminuted, angulated intra-articular colles type fractures of the distal left radius and ulna. 2. Apparent widening of the scapholunate interval, concerning for scapholunate ligament disruption.    ASSESSMENT AND PLAN  61 y.o. year old female here with:   Dx:  1. 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    Penni Bombard, MD 5/79/0383, 3:38 PM Certified in Neurology, Neurophysiology and Neuroimaging  [facility] [address] [place], [place] [address] [phone]

## 2021-07-01 NOTE — Patient Instructions (Signed)
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

## 2021-07-05 ENCOUNTER — Other Ambulatory Visit: Payer: Self-pay | Admitting: Physician Assistant

## 2021-07-31 ENCOUNTER — Other Ambulatory Visit (INDEPENDENT_AMBULATORY_CARE_PROVIDER_SITE_OTHER): Payer: Managed Care, Other (non HMO)

## 2021-07-31 ENCOUNTER — Encounter: Payer: Self-pay | Admitting: Gastroenterology

## 2021-07-31 ENCOUNTER — Ambulatory Visit (INDEPENDENT_AMBULATORY_CARE_PROVIDER_SITE_OTHER): Payer: Managed Care, Other (non HMO) | Admitting: Gastroenterology

## 2021-07-31 VITALS — BP 100/70 | HR 56 | Ht 67.25 in | Wt 135.4 lb

## 2021-07-31 DIAGNOSIS — E559 Vitamin D deficiency, unspecified: Secondary | ICD-10-CM | POA: Diagnosis not present

## 2021-07-31 DIAGNOSIS — K5180 Other ulcerative colitis without complications: Secondary | ICD-10-CM

## 2021-07-31 LAB — COMPREHENSIVE METABOLIC PANEL
ALT: 21 U/L (ref 0–35)
AST: 30 U/L (ref 0–37)
Albumin: 4.1 g/dL (ref 3.5–5.2)
Alkaline Phosphatase: 57 U/L (ref 39–117)
BUN: 18 mg/dL (ref 6–23)
CO2: 34 mEq/L — ABNORMAL HIGH (ref 19–32)
Calcium: 9.4 mg/dL (ref 8.4–10.5)
Chloride: 102 mEq/L (ref 96–112)
Creatinine, Ser: 0.8 mg/dL (ref 0.40–1.20)
GFR: 79.99 mL/min (ref >60.00)
Glucose, Bld: 91 mg/dL (ref 70–99)
Potassium: 4.3 mEq/L (ref 3.5–5.1)
Sodium: 138 mEq/L (ref 135–145)
Total Bilirubin: 0.4 mg/dL (ref 0.2–1.2)
Total Protein: 6.9 g/dL (ref 6.0–8.3)

## 2021-07-31 LAB — IBC + FERRITIN
Ferritin: 95.9 ng/mL (ref 10.0–291.0)
Iron: 53 ug/dL (ref 42–145)
Saturation Ratios: 16.8 % — ABNORMAL LOW (ref 20.0–50.0)
TIBC: 316.4 ug/dL (ref 250.0–450.0)
Transferrin: 226 mg/dL (ref 212.0–360.0)

## 2021-07-31 LAB — CBC WITH DIFFERENTIAL/PLATELET
Basophils Absolute: 0 10*3/uL (ref 0.0–0.1)
Basophils Relative: 0.6 % (ref 0.0–3.0)
Eosinophils Absolute: 0 10*3/uL (ref 0.0–0.7)
Eosinophils Relative: 0 % (ref 0.0–5.0)
HCT: 37.3 % (ref 36.0–46.0)
Hemoglobin: 12.6 g/dL (ref 12.0–15.0)
Lymphocytes Relative: 28.7 % (ref 12.0–46.0)
Lymphs Abs: 1.2 10*3/uL (ref 0.7–4.0)
MCHC: 33.7 g/dL (ref 30.0–36.0)
MCV: 89 fl (ref 78.0–100.0)
Monocytes Absolute: 0.3 10*3/uL (ref 0.1–1.0)
Monocytes Relative: 8.2 % (ref 3.0–12.0)
Neutro Abs: 2.5 10*3/uL (ref 1.4–7.7)
Neutrophils Relative %: 62.5 % (ref 43.0–77.0)
Platelets: 170 10*3/uL (ref 150.0–400.0)
RBC: 4.19 Mil/uL (ref 3.87–5.11)
RDW: 13.9 % (ref 11.5–15.5)
WBC: 4 10*3/uL (ref 4.0–10.5)

## 2021-07-31 LAB — VITAMIN B12: Vitamin B-12: 318 pg/mL (ref 211–911)

## 2021-07-31 LAB — FOLATE: Folate: 6.4 ng/mL (ref >5.9)

## 2021-07-31 LAB — HIGH SENSITIVITY CRP: CRP, High Sensitivity: 5.09 mg/L — ABNORMAL HIGH (ref 0.000–5.000)

## 2021-07-31 NOTE — Patient Instructions (Signed)
Your provider has requested that you go to the basement level for lab work before leaving today. Press "B" on the elevator. The lab is located at the first door on the left as you exit the elevator.   Saunders Glance will be due for your recall colonoscopy in 10/2021  If you are age 61 or older, your body mass index should be between 23-30. Your Body mass index is 21.05 kg/m. If this is out of the aforementioned range listed, please consider follow up with your Primary Care Provider.  If you are age 86 or younger, your body mass index should be between 19-25. Your Body mass index is 21.05 kg/m. If this is out of the aformentioned range listed, please consider follow up with your Primary Care Provider.   ________________________________________________________  The [facility] providers would like to encourage you to use [facility] to communicate with providers for non-urgent requests or questions.  Due to long hold times on the telephone, sending your provider a message by [facility] may be a faster and more efficient way to get a response.  Please allow 48 business hours for a response.  Please remember that this is for non-urgent requests.  _______________________________________________________   I appreciate the  opportunity to care for you  Thank You   Harl Bowie , MD

## 2021-07-31 NOTE — Progress Notes (Signed)
Rachael Walker    [id number]    1960-11-22  Primary Care Physician:Copland, Gay Filler, MD  Referring Physician: Darreld Mclean, MD [address] [place],  [place] [address]   Chief complaint: Ulcerative colitis  HPI:  61 year old very pleasant female here for follow-up visit for ulcerative colitis . Overall she feels symptoms from ulcerative colitis are stable.  Denies any nausea, vomiting, abdominal pain, melena or bright red blood per rectum  She is tolerating mesalamine, has no issues  She is slowly regaining strength in her wrist and is noticing some improvement from the muscle loss in her hand   GI history: She completed 90-day course of budesonide to improve symptoms from an acute flare this past summer.   Colonoscopy Oct 24, 2020 - Preparation of the colon was inadequate. - One 5 mm polyp in the ascending colon, removed with a cold snare. Resected and retrieved. - Patchy mild inflammation was found in the rectum, in the sigmoid colon and in the descending colon secondary to colitis. Biopsied. - The examined portion of the ileum was normal. - Non-bleeding external and internal hemorrhoids.   1. Surgical [P], colon, ascending, polyp (1) - SESSILE SERRATED POLYP WITHOUT CYTOLOGIC DYSPLASIA. 2. Surgical [P], right colon - UNREMARKABLE COLONIC MUCOSA. - NO ACTIVE INFLAMMATION OR GRANULOMAS. - NEGATIVE FOR DYSPLASIA. 3. Surgical [P], left colon - MODERATELY ACTIVE CHRONIC COLITIS CONSISTENT WITH INFLAMMATORY BOWEL DISEASE. - NO GRANULOMAS IDENTIFIED. - NEGATIVE FOR DYSPLASIA. 4. Surgical [P], colon, rectum - MODERATELY ACTIVE CHRONIC COLITIS CONSISTENT WITH INFLAMMATORY BOWEL DISEASE. - NO GRANULOMAS IDENTIFIED. - NEGATIVE FOR DYSPLASIA.   Outpatient Encounter Medications as of 07/31/2021  Medication Sig   estradiol (ESTRACE) 2 MG tablet estradiol 2 mg tablet  TAKE 1 TABLET BY MOUTH EVERY DAY   mesalamine (LIALDA) 1.2 g EC  tablet Take 4 tablets (4.8 g total) by mouth daily with breakfast.   progesterone (PROMETRIUM) 100 MG capsule progesterone micronized 100 mg capsule   PROLIA 60 MG/ML SOSY injection Inject 60 mg into the skin every 6 (six) months.   No facility-administered encounter medications on file as of 07/31/2021.    Allergies as of 07/31/2021 - Review Complete 07/01/2021  Allergen Reaction Noted   Nsaids  04/30/2021    Past Medical History:  Diagnosis Date   Left hand weakness    Muscle atrophy    UC (ulcerative colitis) ([place])     Past Surgical History:  Procedure Laterality Date   BUNIONECTOMY Right    COLONOSCOPY  2009   w/[name]   COLONOSCOPY  10/24/2020   Chayse Gracey   COLONOSCOPY  2018   WRIST FRACTURE SURGERY Left 01/03/2021    Family History  Problem Relation Age of Onset   Osteoporosis Mother    Colon polyps Mother    Cancer Father    Diabetes Father    Hyperlipidemia Father    Colon polyps Brother    Cancer Maternal Grandmother        kidney   Heart disease Maternal Grandfather    Diabetes Paternal Grandmother    Diabetes Brother    Colon cancer Neg Hx     Social History   Socioeconomic History   Marital status: Married    Spouse name: Not on file   Number of children: 3   Years of education: Not on file   Highest education level: Bachelor's degree (e.g., BA, AB, BS)  Occupational History   Not  on file  Tobacco Use   Smoking status: Never   Smokeless tobacco: Never  Vaping Use   Vaping Use: Never used  Substance and Sexual Activity   Alcohol use: Yes    Comment: occ. -2 times a month per pt   Drug use: No   Sexual activity: Not on file  Other Topics Concern   Not on file  Social History Narrative   Lives with husband   Right handed    Caffeine- 3 cups per day     Social Determinants of Health   Financial Resource Strain: Not on file  Food Insecurity: Not on file  Transportation Needs: Not on file  Physical Activity: Not on file  Stress: Not  on file  Social Connections: Not on file  Intimate Partner Violence: Not on file      Review of systems: All other review of systems negative except as mentioned in the HPI.   Physical Exam: Vitals:   07/31/21 1109  BP: 100/70  Pulse: (!) 56   Body mass index is 21.05 kg/m. Gen:      No acute distress HEENT:  sclera anicteric Abd:      soft, non-tender; no palpable masses, no distension Ext:    No edema Neuro: alert and oriented x 3 Psych: normal mood and affect  Data Reviewed:  Reviewed labs, radiology imaging, old records and pertinent past GI work up   Assessment and Plan/Recommendations:  61 year old very pleasant female with ulcerative colitis during an acute flare in May 2022, improved with budesonide and is currently on maintenance dose of Lialda with no clinical symptoms Continue Lialda 4.8 g daily  IBD health maintenance: Follow-up CBC, CMP, CRP, iron panel, B12 and folate   Vitamin D deficiency: Use vitamin D 800 international units daily  Due for surveillance colonoscopy May 2023 to check mucosal healing and also bowel prep was suboptimal on last exam for colorectal cancer screening and polyp detection   Return in 6 months  The patient was provided an opportunity to ask questions and all were answered. The patient agreed with the plan and demonstrated an understanding of the instructions.  Damaris Hippo , MD    CC: Copland, Gay Filler, MD

## 2021-08-05 ENCOUNTER — Encounter: Payer: Self-pay | Admitting: Gastroenterology

## 2021-09-17 ENCOUNTER — Encounter: Payer: Self-pay | Admitting: Gastroenterology

## 2021-10-05 ENCOUNTER — Other Ambulatory Visit: Payer: Self-pay | Admitting: Gastroenterology

## 2021-10-09 ENCOUNTER — Other Ambulatory Visit: Payer: Self-pay | Admitting: Radiology

## 2021-10-09 DIAGNOSIS — Z79890 Hormone replacement therapy (postmenopausal): Secondary | ICD-10-CM

## 2021-11-17 ENCOUNTER — Ambulatory Visit (AMBULATORY_SURGERY_CENTER): Payer: Managed Care, Other (non HMO) | Admitting: *Deleted

## 2021-11-17 VITALS — Ht 67.5 in | Wt 135.0 lb

## 2021-11-17 DIAGNOSIS — Z86010 Personal history of colonic polyps: Secondary | ICD-10-CM

## 2021-11-17 DIAGNOSIS — Z8719 Personal history of other diseases of the digestive system: Secondary | ICD-10-CM

## 2021-11-17 MED ORDER — ONDANSETRON HCL 4 MG PO TABS: ORAL_TABLET | ORAL | 0 refills | Status: DC

## 2021-11-17 MED ORDER — NA SULFATE-K SULFATE-MG SULF 17.5-3.13-1.6 GM/177ML PO SOLN: 1.0000 | Freq: Once | ORAL | 0 refills | Status: AC

## 2021-11-17 NOTE — Progress Notes (Signed)
No egg or soy allergy known to patient  No issues known to pt with past sedation with any surgeries or procedures Patient denies ever being told they had issues or difficulty with intubation  No FH of Malignant Hyperthermia Pt is not on diet pills Pt is not on  home 02  Pt is not on blood thinners  Pt denies issues with constipation  No A fib or A flutter  SUPREP Coupon to pt in PV today , Code to Pharmacy and  NO PA's for preps discussed with pt In PV today  Discussed with pt there will be an out-of-pocket cost for prep and that varies from $0 to 70 +  dollars - pt verbalized understanding  Pt instructed to use Singlecare.com or GoodRx for a price reduction on prep   PV completed over the phone. Pt verified name, DOB, address and insurance during PV today.  Pt mailed instruction packet with copy of consent form to read and not return, and instructions.  Pt encouraged to call with questions or issues.  If pt has My chart, procedure instructions sent via My Chart  Insurance confirmed with pt at [facility] today   PT. wanted to try sutab but when went over instructions requested to go back to [facility] given d/t concerns of keeping prep down.

## 2021-12-03 ENCOUNTER — Encounter: Payer: Self-pay | Admitting: Gastroenterology

## 2021-12-09 ENCOUNTER — Ambulatory Visit (AMBULATORY_SURGERY_CENTER): Payer: Managed Care, Other (non HMO) | Admitting: Gastroenterology

## 2021-12-09 ENCOUNTER — Encounter: Payer: Self-pay | Admitting: Gastroenterology

## 2021-12-09 VITALS — BP 113/63 | HR 46 | Temp 97.3°F | Resp 10 | Ht 67.25 in | Wt 135.0 lb

## 2021-12-09 DIAGNOSIS — Z86010 Personal history of colonic polyps: Secondary | ICD-10-CM

## 2021-12-09 DIAGNOSIS — K635 Polyp of colon: Secondary | ICD-10-CM | POA: Diagnosis not present

## 2021-12-09 DIAGNOSIS — K6289 Other specified diseases of anus and rectum: Secondary | ICD-10-CM

## 2021-12-09 DIAGNOSIS — Z8719 Personal history of other diseases of the digestive system: Secondary | ICD-10-CM | POA: Diagnosis not present

## 2021-12-09 DIAGNOSIS — D122 Benign neoplasm of ascending colon: Secondary | ICD-10-CM

## 2021-12-09 DIAGNOSIS — Z09 Encounter for follow-up examination after completed treatment for conditions other than malignant neoplasm: Secondary | ICD-10-CM

## 2021-12-09 DIAGNOSIS — D120 Benign neoplasm of cecum: Secondary | ICD-10-CM

## 2021-12-09 MED ORDER — SODIUM CHLORIDE 0.9 % IV SOLN: 500.0000 mL | Freq: Once | INTRAVENOUS | Status: DC

## 2021-12-09 NOTE — Progress Notes (Signed)
Report to PACU, RN, vss, BBS= Clear.  

## 2021-12-09 NOTE — Progress Notes (Signed)
[facility] History and Physical   Primary Care Physician:  Pearline Cables, MD   Reason for Procedure:  History of adenomatous colon polyps and ulcerative colitis  Plan:    Surveillance colonoscopy with possible interventions as needed     HPI: Rachael Walker is a very pleasant 61 y.o. female here for surveillance colonoscopy for h/o colon polyps and ulcerative colitis. Bowel prep was inadequate on last exam in May 2022. Denies any nausea, vomiting, abdominal pain, melena or bright red blood per rectum  The risks and benefits as well as alternatives of endoscopic procedure(s) have been discussed and reviewed. All questions answered. The patient agrees to proceed.    Past Medical History:  Diagnosis Date   Left hand weakness    Muscle atrophy    Osteoporosis    UC (ulcerative colitis) ([facility])     Past Surgical History:  Procedure Laterality Date   BUNIONECTOMY Right    COLONOSCOPY  2009   w/[name]   COLONOSCOPY  10/24/2020   Cannen Dupras   COLONOSCOPY  2018   POLYPECTOMY     WRIST FRACTURE SURGERY Left 01/03/2021    Prior to Admission medications   Medication Sig Start Date End Date Taking? Authorizing Provider  estradiol (ESTRACE) 2 MG tablet estradiol 2 mg tablet  TAKE 1 TABLET BY MOUTH EVERY DAY   Yes [provider]  mesalamine (LIALDA) 1.2 g EC tablet TAKE 4 TABLETS BY MOUTH DAILY WITH BREAKFAST. 10/06/21  Yes Eddi Hymes V, MD  ondansetron (ZOFRAN) 4 MG tablet TAKE ZOFRAN 30-60 MINUTES PRIOR TO MIRALAX PREP AND 30-60 MINUTES PRIOR TO EACH DOSE OF SUPREP 11/17/21  Yes Yarexi Pawlicki, Eleonore Chiquito, MD  OVER THE COUNTER MEDICATION Multivitamin 50 Plus tablet  Take 1 tablet every day by oral route.   Yes [provider]  progesterone (PROMETRIUM) 100 MG capsule progesterone micronized 100 mg capsule   Yes [provider]  PROLIA 60 MG/ML SOSY injection Inject 60 mg into the skin every 6 (six) months. 12/27/20   [provider]    Current Outpatient Medications  Medication Sig Dispense Refill   estradiol (ESTRACE) 2 MG tablet estradiol 2 mg tablet  TAKE 1 TABLET BY MOUTH EVERY DAY     mesalamine (LIALDA) 1.2 g EC tablet TAKE 4 TABLETS BY MOUTH DAILY WITH BREAKFAST. 120 tablet 2   ondansetron (ZOFRAN) 4 MG tablet TAKE ZOFRAN 30-60 MINUTES PRIOR TO MIRALAX PREP AND 30-60 MINUTES PRIOR TO EACH DOSE OF SUPREP 3 tablet 0   OVER THE COUNTER MEDICATION Multivitamin 50 Plus tablet  Take 1 tablet every day by oral route.     progesterone (PROMETRIUM) 100 MG capsule progesterone micronized 100 mg capsule     PROLIA 60 MG/ML SOSY injection Inject 60 mg into the skin every 6 (six) months.     Current Facility-Administered Medications  Medication Dose Route Frequency Provider Last Rate Last Admin   0.9 %  sodium chloride infusion  500 mL Intravenous Once Napoleon Form, MD        Allergies as of 12/09/2021 - Review Complete 12/09/2021  Allergen Reaction Noted   Nsaids  04/30/2021    Family History  Problem Relation Age of Onset   Osteoporosis Mother    Colon polyps Mother    Cancer Father    Diabetes Father    Hyperlipidemia Father    Colon polyps Brother    Diabetes Brother    Cancer Maternal Grandmother  kidney   Heart disease Maternal Grandfather    Diabetes Paternal Grandmother    Colon cancer Neg Hx    Crohn's disease Neg Hx    Esophageal cancer Neg Hx    Rectal cancer Neg Hx    Stomach cancer Neg Hx     Social History   Socioeconomic History   Marital status: Married    Spouse name: Not on file   Number of children: 3   Years of education: Not on file   Highest education level: Bachelor's degree (e.g., BA, AB, BS)  Occupational History   Not on file  Tobacco Use   Smoking status: Never    Passive exposure: Never   Smokeless tobacco: Never  Vaping Use   Vaping Use: Never used  Substance and Sexual Activity   Alcohol use: Yes    Comment: occ. -2 times a month per pt   Drug  use: No   Sexual activity: Not on file  Other Topics Concern   Not on file  Social History Narrative   Lives with husband   Right handed    Caffeine- 3 cups per day     Social Determinants of Health   Financial Resource Strain: Not on file  Food Insecurity: Not on file  Transportation Needs: Not on file  Physical Activity: Not on file  Stress: Not on file  Social Connections: Not on file  Intimate Partner Violence: Not on file    Review of Systems:  All other review of systems negative except as mentioned in the HPI.  Physical Exam: Vital signs in last 24 hours: BP 104/64 (BP Location: Right Arm, Patient Position: Sitting, Cuff Size: Normal)   Pulse (!) 48   Temp (!) 97.3 F (36.3 C) (Temporal)   Ht 5' 7.25" (1.708 m)   Wt 135 lb (61.2 kg)   LMP 02/24/2015 Comment: per pt doesn't last long, it's was for 1 day  SpO2 98%   BMI 20.99 kg/m  General:   Alert, NAD Lungs:  Clear .   Heart:  Regular rate and rhythm Abdomen:  Soft, nontender and nondistended. Neuro/Psych:  Alert and cooperative. Normal mood and affect. A and O x 3  Reviewed labs, radiology imaging, old records and pertinent past GI work up  Patient is appropriate for planned procedure(s) and anesthesia in an ambulatory setting   K. Scherry Ran , MD [phone]

## 2021-12-10 ENCOUNTER — Telehealth: Payer: Self-pay | Admitting: *Deleted

## 2021-12-10 NOTE — Telephone Encounter (Signed)
Follow up call attempt.  LVM to call if any questions or concerns. 

## 2021-12-11 ENCOUNTER — Other Ambulatory Visit: Payer: Self-pay

## 2021-12-29 ENCOUNTER — Encounter: Payer: Self-pay | Admitting: Gastroenterology

## 2022-01-14 ENCOUNTER — Other Ambulatory Visit: Payer: Self-pay | Admitting: Gastroenterology

## 2022-02-03 NOTE — Progress Notes (Deleted)
[place] at [facility] [address], [place], [place] [address] [phone]  Date:  02/05/2022   Name:  Rachael Walker   DOB:  [birth year]   MRN:  [id number]  PCP:  Darreld Mclean, MD    Chief Complaint: No chief complaint on file.   History of Present Illness:  Rachael Walker is a 61 y.o. very pleasant female patient who presents with the following:  Pt seen today with concern of difficulty swallowing Last seen by myself 4/22  She had a colon 6/23 for concern of UC per Dr Silverio Decamp   Mammogram Pap  Covid booster shingrix  Patient Active Problem List   Diagnosis Date Noted   Rupture of medial head of gastrocnemius, left, subsequent encounter 01/02/2020   Posterior pain of right hip 12/10/2016   BUNION, RIGHT FOOT 04/23/2010   ABNORMALITY OF GAIT 04/23/2010   UNEQUAL LEG LENGTH 03/31/2010   PLANTAR FASCIITIS, BILATERAL 03/13/2010   STRESS FRACTURE, FOOT 03/13/2010    Past Medical History:  Diagnosis Date   Left hand weakness    Muscle atrophy    Osteoporosis    UC (ulcerative colitis) ([place])     Past Surgical History:  Procedure Laterality Date   BUNIONECTOMY Right    COLONOSCOPY  2009   w/[name]   COLONOSCOPY  10/24/2020   nandigam   COLONOSCOPY  2018   POLYPECTOMY     WRIST FRACTURE SURGERY Left 01/03/2021    Social History   Tobacco Use   Smoking status: Never    Passive exposure: Never   Smokeless tobacco: Never  Vaping Use   Vaping Use: Never used  Substance Use Topics   Alcohol use: Yes    Comment: occ. -2 times a month per pt   Drug use: No    Family History  Problem Relation Age of Onset   Osteoporosis Mother    Colon polyps Mother    Cancer Father    Diabetes Father    Hyperlipidemia Father    Colon polyps Brother    Diabetes Brother    Cancer Maternal Grandmother        kidney   Heart disease Maternal Grandfather    Diabetes Paternal Grandmother    Colon cancer Neg Hx     Crohn's disease Neg Hx    Esophageal cancer Neg Hx    Rectal cancer Neg Hx    Stomach cancer Neg Hx     Allergies  Allergen Reactions   Nsaids     Other reaction(s): Other (See Comments) Ulcerative colitis    Medication list has been reviewed and updated.  Current Outpatient Medications on File Prior to Visit  Medication Sig Dispense Refill   estradiol (ESTRACE) 2 MG tablet estradiol 2 mg tablet  TAKE 1 TABLET BY MOUTH EVERY DAY     mesalamine (LIALDA) 1.2 g EC tablet TAKE 4 TABLETS BY MOUTH DAILY WITH BREAKFAST. 120 tablet 2   ondansetron (ZOFRAN) 4 MG tablet TAKE ZOFRAN 30-60 MINUTES PRIOR TO MIRALAX PREP AND 30-60 MINUTES PRIOR TO EACH DOSE OF SUPREP 3 tablet 0   OVER THE COUNTER MEDICATION Multivitamin 50 Plus tablet  Take 1 tablet every day by oral route.     progesterone (PROMETRIUM) 100 MG capsule progesterone micronized 100 mg capsule     PROLIA 60 MG/ML SOSY injection Inject 60 mg into the skin every 6 (six) months.     No current facility-administered medications on file  prior to visit.    Review of Systems:  As per HPI- otherwise negative.   Physical Examination: There were no vitals filed for this visit. There were no vitals filed for this visit. There is no height or weight on file to calculate BMI. Ideal Body Weight:    GEN: no acute distress. HEENT: Atraumatic, Normocephalic.  Ears and Nose: No external deformity. CV: RRR, No M/G/R. No JVD. No thrill. No extra heart sounds. PULM: CTA B, no wheezes, crackles, rhonchi. No retractions. No resp. distress. No accessory muscle use. ABD: S, NT, ND, +BS. No rebound. No HSM. EXTR: No c/c/e PSYCH: Normally interactive. Conversant.    Assessment and Plan: ***  Signed Lamar Blinks, MD

## 2022-02-05 ENCOUNTER — Ambulatory Visit: Payer: Managed Care, Other (non HMO) | Admitting: Family Medicine

## 2022-02-08 NOTE — Patient Instructions (Incomplete)
Good to see you again today  

## 2022-02-08 NOTE — Progress Notes (Unsigned)
[place] at [facility] [address] [place], [place] [address] [phone]  Date:  02/09/2022   Name:  Rachael Walker   DOB:  Feb 13, 1961   MRN:  [id number]  PCP:  Darreld Mclean, MD    Chief Complaint: No chief complaint on file.   History of Present Illness:  Rachael Walker is a 61 y.o. very pleasant female patient who presents with the following:  Patient seen today with concern of swallowing problem Most recent visit with myself April 2022 Generally in good health except for some MSK issues, ulcerative colitis Her gastroenterologist is Dr. Silverio Decamp Maintenance Lialda 4.8 g daily  Mammogram Pap smear Recommend COVID and flu vaccines this fall Shingles vaccine Colonoscopy completed in June Some lab work done in [facility], CRP, iron, B12, CBC Patient Active Problem List   Diagnosis Date Noted   Rupture of medial head of gastrocnemius, left, subsequent encounter 01/02/2020   Posterior pain of right hip 12/10/2016   BUNION, RIGHT FOOT 04/23/2010   ABNORMALITY OF GAIT 04/23/2010   UNEQUAL LEG LENGTH 03/31/2010   PLANTAR FASCIITIS, BILATERAL 03/13/2010   STRESS FRACTURE, FOOT 03/13/2010    Past Medical History:  Diagnosis Date   Left hand weakness    Muscle atrophy    Osteoporosis    UC (ulcerative colitis) ([place])     Past Surgical History:  Procedure Laterality Date   BUNIONECTOMY Right    COLONOSCOPY  2009   w/[name]   COLONOSCOPY  10/24/2020   nandigam   COLONOSCOPY  2018   POLYPECTOMY     WRIST FRACTURE SURGERY Left 01/03/2021    Social History   Tobacco Use   Smoking status: Never    Passive exposure: Never   Smokeless tobacco: Never  Vaping Use   Vaping Use: Never used  Substance Use Topics   Alcohol use: Yes    Comment: occ. -2 times a month per pt   Drug use: No    Family History  Problem Relation Age of Onset   Osteoporosis Mother    Colon polyps Mother    Cancer Father     Diabetes Father    Hyperlipidemia Father    Colon polyps Brother    Diabetes Brother    Cancer Maternal Grandmother        kidney   Heart disease Maternal Grandfather    Diabetes Paternal Grandmother    Colon cancer Neg Hx    Crohn's disease Neg Hx    Esophageal cancer Neg Hx    Rectal cancer Neg Hx    Stomach cancer Neg Hx     Allergies  Allergen Reactions   Nsaids     Other reaction(s): Other (See Comments) Ulcerative colitis    Medication list has been reviewed and updated.  Current Outpatient Medications on File Prior to Visit  Medication Sig Dispense Refill   estradiol (ESTRACE) 2 MG tablet estradiol 2 mg tablet  TAKE 1 TABLET BY MOUTH EVERY DAY     mesalamine (LIALDA) 1.2 g EC tablet TAKE 4 TABLETS BY MOUTH DAILY WITH BREAKFAST. 120 tablet 2   ondansetron (ZOFRAN) 4 MG tablet TAKE ZOFRAN 30-60 MINUTES PRIOR TO MIRALAX PREP AND 30-60 MINUTES PRIOR TO EACH DOSE OF SUPREP 3 tablet 0   OVER THE COUNTER MEDICATION Multivitamin 50 Plus tablet  Take 1 tablet every day by oral route.     progesterone (PROMETRIUM) 100 MG capsule progesterone micronized 100 mg capsule  PROLIA 60 MG/ML SOSY injection Inject 60 mg into the skin every 6 (six) months.     No current facility-administered medications on file prior to visit.    Review of Systems:  As per HPI- otherwise negative.   Physical Examination: There were no vitals filed for this visit. There were no vitals filed for this visit. There is no height or weight on file to calculate BMI. Ideal Body Weight:    GEN: no acute distress. HEENT: Atraumatic, Normocephalic.  Ears and Nose: No external deformity. CV: RRR, No M/G/R. No JVD. No thrill. No extra heart sounds. PULM: CTA B, no wheezes, crackles, rhonchi. No retractions. No resp. distress. No accessory muscle use. ABD: S, NT, ND, +BS. No rebound. No HSM. EXTR: No c/c/e PSYCH: Normally interactive. Conversant.    Assessment and Plan: ***  Signed Lamar Blinks, MD

## 2022-02-09 ENCOUNTER — Ambulatory Visit (INDEPENDENT_AMBULATORY_CARE_PROVIDER_SITE_OTHER): Payer: Managed Care, Other (non HMO) | Admitting: Family Medicine

## 2022-02-09 VITALS — BP 112/60 | HR 59 | Temp 97.8°F | Resp 18 | Ht 67.0 in | Wt 141.8 lb

## 2022-02-09 DIAGNOSIS — T17308A Unspecified foreign body in larynx causing other injury, initial encounter: Secondary | ICD-10-CM | POA: Diagnosis not present

## 2022-02-09 DIAGNOSIS — Z23 Encounter for immunization: Secondary | ICD-10-CM

## 2022-03-12 ENCOUNTER — Encounter: Payer: Self-pay | Admitting: Gastroenterology

## 2022-03-12 ENCOUNTER — Ambulatory Visit (INDEPENDENT_AMBULATORY_CARE_PROVIDER_SITE_OTHER): Payer: Managed Care, Other (non HMO) | Admitting: Gastroenterology

## 2022-03-12 ENCOUNTER — Other Ambulatory Visit (INDEPENDENT_AMBULATORY_CARE_PROVIDER_SITE_OTHER): Payer: Managed Care, Other (non HMO)

## 2022-03-12 VITALS — BP 128/78 | HR 60 | Ht 67.0 in | Wt 138.0 lb

## 2022-03-12 DIAGNOSIS — K5190 Ulcerative colitis, unspecified, without complications: Secondary | ICD-10-CM

## 2022-03-12 DIAGNOSIS — R1310 Dysphagia, unspecified: Secondary | ICD-10-CM | POA: Diagnosis not present

## 2022-03-12 LAB — VITAMIN B12: Vitamin B-12: 414 pg/mL (ref 211–911)

## 2022-03-12 LAB — C-REACTIVE PROTEIN: CRP: <1 mg/dL (ref 0.5–20.0)

## 2022-03-12 LAB — VITAMIN D 25 HYDROXY (VIT D DEFICIENCY, FRACTURES): VITD: 37.16 ng/mL (ref 30.00–100.00)

## 2022-03-12 LAB — FOLATE: Folate: >23.9 ng/mL (ref >5.9)

## 2022-03-12 NOTE — Patient Instructions (Signed)
Your provider has requested that you go to the basement level for lab work before leaving today. Press "B" on the elevator. The lab is located at the first door on the left as you exit the elevator.  You have been scheduled for an endoscopy. Please follow written instructions given to you at your visit today. If you use inhalers (even only as needed), please bring them with you on the day of your procedure.  The [facility] providers would like to encourage you to use MYCHART to communicate with providers for non-urgent requests or questions.  Due to long hold times on the telephone, sending your provider a message by MYCHART may be a faster and more efficient way to get a response.  Please allow 48 business hours for a response.  Please remember that this is for non-urgent requests.   Due to recent changes in healthcare laws, you may see the results of your imaging and laboratory studies on MyChart before your provider has had a chance to review them.  We understand that in some cases there may be results that are confusing or concerning to you. Not all laboratory results come back in the same time frame and the provider may be waiting for multiple results in order to interpret others.  Please give us 48 hours in order for your provider to thoroughly review all the results before contacting the office for clarification of your results.   

## 2022-03-12 NOTE — Progress Notes (Signed)
Rachael Walker    [id number]    1960/12/11  Primary Care Physician:Copland, Gay Filler, MD  Referring Physician: Darreld Mclean, [address] [place],  [place] [address]   Chief complaint:  Dysphagia, aphthous ulcers  HPI:  61 year old very pleasant female here for follow-up visit for ulcerative colitis and with complaints recent onset worsening dysphagia She is having more difficulty swallowing, sometimes has to swallow multiple times to get food down especially worse with meat.  She had 2 episodes of choking in the recent past that needed her husband to do the Heimlich's maneuver.  Once with shrimp and once with food Denies any regurgitation or vomiting   Overall she feels symptoms from ulcerative colitis are stable.  Denies any nausea, vomiting, abdominal pain, melena or bright red blood per rectum   She is tolerating mesalamine, has no issues   She is slowly regaining strength in her wrist and is noticing some improvement from the muscle loss in her hand     GI history: She completed 90-day course of budesonide to improve symptoms from an acute flare summer 2022.     Colonoscopy Oct 24, 2020 - Preparation of the colon was inadequate. - One 5 mm polyp in the ascending colon, removed with a cold snare. Resected and retrieved. - Patchy mild inflammation was found in the rectum, in the sigmoid colon and in the descending colon secondary to colitis. Biopsied. - The examined portion of the ileum was normal. - Non-bleeding external and internal hemorrhoids.   1. Surgical [P], colon, ascending, polyp (1) - SESSILE SERRATED POLYP WITHOUT CYTOLOGIC DYSPLASIA. 2. Surgical [P], right colon - UNREMARKABLE COLONIC MUCOSA. - NO ACTIVE INFLAMMATION OR GRANULOMAS. - NEGATIVE FOR DYSPLASIA. 3. Surgical [P], left colon - MODERATELY ACTIVE CHRONIC COLITIS CONSISTENT WITH INFLAMMATORY BOWEL DISEASE. - NO GRANULOMAS IDENTIFIED. - NEGATIVE FOR  DYSPLASIA. 4. Surgical [P], colon, rectum - MODERATELY ACTIVE CHRONIC COLITIS CONSISTENT WITH INFLAMMATORY BOWEL DISEASE. - NO GRANULOMAS IDENTIFIED. - NEGATIVE FOR DYSPLASIA.   Outpatient Encounter Medications as of 03/12/2022  Medication Sig   estradiol (ESTRACE) 2 MG tablet estradiol 2 mg tablet  TAKE 1 TABLET BY MOUTH EVERY DAY   mesalamine (LIALDA) 1.2 g EC tablet TAKE 4 TABLETS BY MOUTH DAILY WITH BREAKFAST.   OVER THE COUNTER MEDICATION Multivitamin 50 Plus tablet  Take 1 tablet every day by oral route.   progesterone (PROMETRIUM) 100 MG capsule progesterone micronized 100 mg capsule   PROLIA 60 MG/ML SOSY injection Inject 60 mg into the skin every 6 (six) months.   No facility-administered encounter medications on file as of 03/12/2022.    Allergies as of 03/12/2022 - Review Complete 03/12/2022  Allergen Reaction Noted   Nsaids  04/30/2021    Past Medical History:  Diagnosis Date   Left hand weakness    Muscle atrophy    Osteoporosis    UC (ulcerative colitis) ([place])     Past Surgical History:  Procedure Laterality Date   BUNIONECTOMY Right    COLONOSCOPY  2009   w/[name]   COLONOSCOPY  10/24/2020   Josselyn Harkins   COLONOSCOPY  2018   POLYPECTOMY     WRIST FRACTURE SURGERY Left 01/03/2021    Family History  Problem Relation Age of Onset   Osteoporosis Mother    Colon polyps Mother    Cancer Father    Diabetes Father    Hyperlipidemia Father    Colon polyps  Brother    Diabetes Brother    Cancer Maternal Grandmother        kidney   Heart disease Maternal Grandfather    Diabetes Paternal Grandmother    Colon cancer Neg Hx    Crohn's disease Neg Hx    Esophageal cancer Neg Hx    Rectal cancer Neg Hx    Stomach cancer Neg Hx     Social History   Socioeconomic History   Marital status: Married    Spouse name: Not on file   Number of children: 3   Years of education: Not on file   Highest education level: Bachelor's degree (e.g., BA, AB, BS)   Occupational History   Not on file  Tobacco Use   Smoking status: Never    Passive exposure: Never   Smokeless tobacco: Never  Vaping Use   Vaping Use: Never used  Substance and Sexual Activity   Alcohol use: Yes    Comment: occ. -2 times a month per pt   Drug use: No   Sexual activity: Not on file  Other Topics Concern   Not on file  Social History Narrative   Lives with husband   Right handed    Caffeine- 3 cups per day     Social Determinants of Health   Financial Resource Strain: Not on file  Food Insecurity: Not on file  Transportation Needs: Not on file  Physical Activity: Not on file  Stress: Not on file  Social Connections: Not on file  Intimate Partner Violence: Not on file      Review of systems: All other review of systems negative except as mentioned in the HPI.   Physical Exam: Vitals:   03/12/22 0955  BP: 128/78  Pulse: 60   Body mass index is 21.61 kg/m. Gen:      No acute distress HEENT:  sclera anicteric Abd:      soft, non-tender; no palpable masses, no distension Ext:    No edema Neuro: alert and oriented x 3 Psych: normal mood and affect  Data Reviewed:  Reviewed labs, radiology imaging, old records and pertinent past GI work up   Assessment and Plan/Recommendations:  61 year old very pleasant female with ulcerative colitis during an acute flare in May 2022, improved with budesonide and is currently on maintenance dose of Lialda in clinical remission Continue Lialda 4.8 g daily   IBD health maintenance: Follow-up CBC, CMP, CRP, iron panel, B12, vitamin D and folate We will check fecal calprotectin to monitor disease activity of ulcerative colitis Fecal calprotectin is undetectable, will defer colonoscopy for an additional year  Dysphagia: Schedule for EGD for evaluation with esophageal biopsies and esophageal dilation as needed Peptic stricture, eosinophilic esophagitis or neoplasia in the differential Further management based  on findings on EGD The risks and benefits as well as alternatives of endoscopic procedure(s) have been discussed and reviewed. All questions answered. The patient agrees to proceed.    Return in 3 months  This visit required 40 minutes of patient care (this includes precharting, chart review, review of results, face-to-face time used for counseling as well as treatment plan and follow-up. The patient was provided an opportunity to ask questions and all were answered. The patient agreed with the plan and demonstrated an understanding of the instructions.  Damaris Hippo , MD    CC: Copland, Gay Filler, MD

## 2022-03-13 ENCOUNTER — Encounter: Payer: Self-pay | Admitting: Gastroenterology

## 2022-03-13 ENCOUNTER — Other Ambulatory Visit: Payer: Managed Care, Other (non HMO)

## 2022-03-13 ENCOUNTER — Ambulatory Visit: Payer: Managed Care, Other (non HMO) | Admitting: Gastroenterology

## 2022-03-13 VITALS — BP 104/65 | HR 51 | Temp 97.5°F | Resp 13 | Ht 67.0 in | Wt 138.0 lb

## 2022-03-13 DIAGNOSIS — K5190 Ulcerative colitis, unspecified, without complications: Secondary | ICD-10-CM

## 2022-03-13 DIAGNOSIS — K219 Gastro-esophageal reflux disease without esophagitis: Secondary | ICD-10-CM | POA: Diagnosis not present

## 2022-03-13 DIAGNOSIS — K2970 Gastritis, unspecified, without bleeding: Secondary | ICD-10-CM

## 2022-03-13 DIAGNOSIS — R1310 Dysphagia, unspecified: Secondary | ICD-10-CM

## 2022-03-13 DIAGNOSIS — R1319 Other dysphagia: Secondary | ICD-10-CM

## 2022-03-13 DIAGNOSIS — B9681 Helicobacter pylori [H. pylori] as the cause of diseases classified elsewhere: Secondary | ICD-10-CM

## 2022-03-13 MED ORDER — SODIUM CHLORIDE 0.9 % IV SOLN: 500.0000 mL | Freq: Once | INTRAVENOUS | Status: DC

## 2022-03-13 MED ORDER — PANTOPRAZOLE SODIUM 40 MG PO TBEC: 40.0000 mg | DELAYED_RELEASE_TABLET | Freq: Every day | ORAL | 0 refills | Status: DC

## 2022-03-13 NOTE — Progress Notes (Signed)
Please refer to office visit note 03/12/22. No additional changes in H&P Patient is appropriate for planned procedure(s) and anesthesia in an ambulatory setting  K. Denzil Magnuson , MD [phone]

## 2022-03-13 NOTE — Progress Notes (Signed)
VS completed by DT.  Pt's states no medical or surgical changes since previsit or office visit.  

## 2022-03-13 NOTE — Progress Notes (Signed)
Pt resting comfortably. VSS. Airway intact. SBAR complete to RN. All questions answered.   

## 2022-03-13 NOTE — Progress Notes (Signed)
Called to room to assist during endoscopic procedure.  Patient ID and intended procedure confirmed with present staff. Received instructions for my participation in the procedure from the performing physician.  

## 2022-03-13 NOTE — Patient Instructions (Signed)
Thank you for coming in to see [place] today! Resume previous diet and medications today. Return to normal daily activities tomorrow. New prescription for Protonix 40 mg one daily before a meal.  Continue for 3 months.  This prescription was sent your pharmacy. Biopsy results will be available in 1-2 weeks.  We will contact you with the findings and recommendations.   YOU HAD AN ENDOSCOPIC PROCEDURE TODAY AT [facility]:   Refer to the procedure report that was given to you for any specific questions about what was found during the examination.  If the procedure report does not answer your questions, please call your gastroenterologist to clarify.  If you requested that your care partner not be given the details of your procedure findings, then the procedure report has been included in a sealed envelope for you to review at your convenience later.  YOU SHOULD EXPECT: Some feelings of bloating in the abdomen. Passage of more gas than usual.  Walking can help get rid of the air that was put into your GI tract during the procedure and reduce the bloating. If you had a lower endoscopy (such as a colonoscopy or flexible sigmoidoscopy) you may notice spotting of blood in your stool or on the toilet paper. If you underwent a bowel prep for your procedure, you may not have a normal bowel movement for a few days.  Please Note:  You might notice some irritation and congestion in your nose or some drainage.  This is from the oxygen used during your procedure.  There is no need for concern and it should clear up in a day or so.  SYMPTOMS TO REPORT IMMEDIATELY:   Following upper endoscopy (EGD)  Vomiting of blood or coffee ground material  New chest pain or pain under the shoulder blades  Painful or persistently difficult swallowing  New shortness of breath  Fever of 100F or higher  Black, tarry-looking stools  For urgent or emergent issues, a gastroenterologist can be reached at any hour by  calling [phone]. Do not use MyChart messaging for urgent concerns.    DIET:  We do recommend a small meal at first, but then you may proceed to your regular diet.  Drink plenty of fluids but you should avoid alcoholic beverages for 24 hours.  ACTIVITY:  You should plan to take it easy for the rest of today and you should NOT DRIVE or use heavy machinery until tomorrow (because of the sedation medicines used during the test).    FOLLOW UP: Our staff will call the number listed on your records the next business day following your procedure.  We will call around 7:15- 8:00 am to check on you and address any questions or concerns that you may have regarding the information given to you following your procedure. If we do not reach you, we will leave a message.     If any biopsies were taken you will be contacted by phone or by letter within the next 1-3 weeks.  Please call us at [phone] if you have not heard about the biopsies in 3 weeks.    SIGNATURES/CONFIDENTIALITY: You and/or your care partner have signed paperwork which will be entered into your electronic medical record.  These signatures attest to the fact that that the information above on your After Visit Summary has been reviewed and is understood.  Full responsibility of the confidentiality of this discharge information lies with you and/or your care-partner.

## 2022-03-13 NOTE — Op Note (Signed)
[facility] Patient Name: Rachael Walker Procedure Date: 03/13/2022 10:10 AM MRN: [id number] Endoscopist: Mauri Pole , MD Age: 62 Referring MD:  Date of Birth: 1961/01/30 Gender: Female Account #: [id number] Procedure:                Upper GI endoscopy Indications:              Dysphagia Medicines:                Monitored Anesthesia Care Procedure:                Pre-Anesthesia Assessment:                           - Prior to the procedure, a History and Physical                            was performed, and patient medications and                            allergies were reviewed. The patient's tolerance of                            previous anesthesia was also reviewed. The risks                            and benefits of the procedure and the sedation                            options and risks were discussed with the patient.                            All questions were answered, and informed consent                            was obtained. Prior Anticoagulants: The patient has                            taken no previous anticoagulant or antiplatelet                            agents. ASA Grade Assessment: II - A patient with                            mild systemic disease. After reviewing the risks                            and benefits, the patient was deemed in                            satisfactory condition to undergo the procedure.                           After obtaining informed consent, the endoscope was  passed under direct vision. Throughout the                            procedure, the patient's blood pressure, pulse, and                            oxygen saturations were monitored continuously. The                            Endoscope was introduced through the mouth, and                            advanced to the second part of duodenum. The upper                            GI endoscopy was accomplished without  difficulty.                            The patient tolerated the procedure well. Scope In: Scope Out: Findings:                 The Z-line was regular and was found 38 cm from the                            incisors.                           No endoscopic abnormality was evident in the                            esophagus to explain the patient's complaint of                            dysphagia. It was decided, however, to proceed with                            dilation of the lower third of the esophagus. A TTS                            dilator was passed through the scope. Dilation with                            an 18-19-20 mm balloon dilator was performed to 20                            mm. The dilation site was examined following                            endoscope reinsertion and showed no change.                            Biopsies were obtained from the proximal and distal  esophagus with cold forceps for histology of                            suspected eosinophilic esophagitis.                           The gastroesophageal flap valve was visualized                            endoscopically and classified as Hill Grade II                            (fold present, opens with respiration).                           The stomach was normal.                           The cardia and gastric fundus were normal on                            retroflexion.                           The examined duodenum was normal. Complications:            No immediate complications. Estimated Blood Loss:     Estimated blood loss was minimal. Impression:               - Z-line regular, 38 cm from the incisors.                           - No endoscopic esophageal abnormality to explain                            patient's dysphagia. Esophagus dilated. Dilated.                            Biopsied.                           - Gastroesophageal flap valve classified as Hill                             Grade II (fold present, opens with respiration).                           - Normal stomach.                           - Normal examined duodenum. Recommendation:           - Patient has a contact number available for                            emergencies. The signs and symptoms of potential  delayed complications were discussed with the                            patient. Return to normal activities tomorrow.                            Written discharge instructions were provided to the                            patient.                           - Resume previous diet.                           - Continue present medications.                           - Await pathology results.                           - Follow an antireflux regimen.                           - Use Protonix (pantoprazole) 40 mg PO daily for 3                            months.                           - Follow up in office visit in 3 months Mauri Pole, MD 03/13/2022 10:58:38 AM This report has been signed electronically.

## 2022-03-16 ENCOUNTER — Telehealth: Payer: Self-pay | Admitting: *Deleted

## 2022-03-16 NOTE — Telephone Encounter (Signed)
No answer on  follow up call. Left message.   

## 2022-03-17 LAB — CALPROTECTIN, FECAL: Calprotectin, Fecal: 9 ug/g (ref 0–120)

## 2022-03-20 ENCOUNTER — Other Ambulatory Visit: Payer: Self-pay

## 2022-03-20 ENCOUNTER — Telehealth: Payer: Self-pay | Admitting: Gastroenterology

## 2022-03-20 MED ORDER — METRONIDAZOLE 250 MG PO TABS: 250.0000 mg | ORAL_TABLET | Freq: Four times a day (QID) | ORAL | 0 refills | Status: AC

## 2022-03-20 MED ORDER — DOXYCYCLINE HYCLATE 100 MG PO CAPS: 100.0000 mg | ORAL_CAPSULE | Freq: Two times a day (BID) | ORAL | 0 refills | Status: AC

## 2022-03-20 NOTE — Telephone Encounter (Signed)
See lab reports.

## 2022-03-20 NOTE — Telephone Encounter (Signed)
Inbound call from patient stating she is returning a call? Please give patient a call back to further advise.  Thank you

## 2022-03-27 ENCOUNTER — Other Ambulatory Visit: Payer: Self-pay

## 2022-03-27 ENCOUNTER — Encounter: Payer: Self-pay | Admitting: Gastroenterology

## 2022-03-27 ENCOUNTER — Telehealth: Payer: Self-pay

## 2022-03-27 MED ORDER — ONDANSETRON 4 MG PO TBDP: 4.0000 mg | ORAL_TABLET | Freq: Three times a day (TID) | ORAL | 0 refills | Status: DC | PRN

## 2022-03-27 NOTE — Telephone Encounter (Signed)
Inbound call from patient stating that the insurance will not cover Zofran. Patient is requesting an alternative. Please advise.

## 2022-03-27 NOTE — Telephone Encounter (Signed)
Please go ahead and give Zofran 4 mg ODT Q8hrs prn #20. NR RG

## 2022-03-27 NOTE — Telephone Encounter (Signed)
Dr Lyndel Safe I failed to remember Dr Silverio Decamp is off today. The morning DOD is at the hospital as the [facility] [place]. Would you review as the DOD this afternoon, please? Can the patient have Zofran for the nausea she is experiencing while on H Pylori quad-therapy with Doxycycline and Flagyl? Normal LFT at last [facility] in February 2023.

## 2022-03-27 NOTE — Telephone Encounter (Signed)
Patient calls about nausea. She states, "I am really struggling with this treatment (H Pylori) and my nausea gets worse as the day goes on."  Patient requests medication for nausea. Please advise.

## 2022-03-27 NOTE — Telephone Encounter (Signed)
Patient is requesting a call back.

## 2022-03-27 NOTE — Telephone Encounter (Signed)
Patient advised. Rx to her pharmacy, [facility] on [address].

## 2022-03-28 MED ORDER — ONDANSETRON HCL 4 MG PO TABS: 4.0000 mg | ORAL_TABLET | Freq: Three times a day (TID) | ORAL | 0 refills | Status: DC | PRN

## 2022-03-28 NOTE — Telephone Encounter (Signed)
Received a call on 10/13 p.m. Unfortunately Zofran ODT was not covered. Sent Zofran not ODT to see if that is covered otherwise she will need Phenergan or Compazine to be sent. Please check in with patient on Monday. Thanks. GM

## 2022-03-28 NOTE — Addendum Note (Signed)
Addended by: Justice Britain on: 03/28/2022 06:41 AM   Modules accepted: Orders

## 2022-04-03 ENCOUNTER — Other Ambulatory Visit: Payer: Self-pay

## 2022-04-03 DIAGNOSIS — A048 Other specified bacterial intestinal infections: Secondary | ICD-10-CM

## 2022-04-16 ENCOUNTER — Other Ambulatory Visit: Payer: Self-pay | Admitting: Gastroenterology

## 2022-04-20 ENCOUNTER — Other Ambulatory Visit: Payer: Managed Care, Other (non HMO)

## 2022-04-20 DIAGNOSIS — A048 Other specified bacterial intestinal infections: Secondary | ICD-10-CM

## 2022-04-22 LAB — H. PYLORI ANTIGEN, STOOL: H pylori Ag, Stl: NEGATIVE

## 2022-05-27 ENCOUNTER — Other Ambulatory Visit: Payer: Self-pay | Admitting: Family Medicine

## 2022-05-27 ENCOUNTER — Encounter: Payer: Self-pay | Admitting: Family Medicine

## 2022-05-27 DIAGNOSIS — E2839 Other primary ovarian failure: Secondary | ICD-10-CM

## 2022-05-30 MED ORDER — PROGESTERONE MICRONIZED 100 MG PO CAPS: 100.0000 mg | ORAL_CAPSULE | Freq: Every day | ORAL | 3 refills | Status: DC

## 2022-05-30 MED ORDER — ESTRADIOL 2 MG PO TABS: 2.0000 mg | ORAL_TABLET | Freq: Every day | ORAL | 3 refills | Status: DC

## 2022-06-04 ENCOUNTER — Encounter: Payer: Self-pay | Admitting: *Deleted

## 2022-06-04 ENCOUNTER — Ambulatory Visit (INDEPENDENT_AMBULATORY_CARE_PROVIDER_SITE_OTHER): Payer: Managed Care, Other (non HMO) | Admitting: Gastroenterology

## 2022-06-04 VITALS — BP 100/62 | HR 45 | Ht 67.0 in | Wt 139.0 lb

## 2022-06-04 DIAGNOSIS — K219 Gastro-esophageal reflux disease without esophagitis: Secondary | ICD-10-CM

## 2022-06-04 DIAGNOSIS — K5190 Ulcerative colitis, unspecified, without complications: Secondary | ICD-10-CM

## 2022-06-04 DIAGNOSIS — Z8619 Personal history of other infectious and parasitic diseases: Secondary | ICD-10-CM | POA: Diagnosis not present

## 2022-06-04 MED ORDER — PANTOPRAZOLE SODIUM 20 MG PO TBEC: 20.0000 mg | DELAYED_RELEASE_TABLET | Freq: Every day | ORAL | 1 refills | Status: DC

## 2022-06-04 MED ORDER — MESALAMINE 1.2 G PO TBEC: DELAYED_RELEASE_TABLET | ORAL | 1 refills | Status: DC

## 2022-06-04 NOTE — Patient Instructions (Signed)
We have sent the following medications to your pharmacy for you to pick up at your convenience: Pantoprazole 20 mg daily Lialda 4.8 grams  Please follow up with Dr Silverio Decamp in 6 months.  _______________________________________________________  If you are age 61 or older, your body mass index should be between 23-30. Your Body mass index is 21.77 kg/m. If this is out of the aforementioned range listed, please consider follow up with your Primary Care Provider.  If you are age 40 or younger, your body mass index should be between 19-25. Your Body mass index is 21.77 kg/m. If this is out of the aformentioned range listed, please consider follow up with your Primary Care Provider.   ________________________________________________________  The [facility] providers would like to encourage you to use [facility] to communicate with providers for non-urgent requests or questions.  Due to long hold times on the telephone, sending your provider a message by [facility] may be a faster and more efficient way to get a response.  Please allow 48 business hours for a response.  Please remember that this is for non-urgent requests.  _______________________________________________________  Due to recent changes in healthcare laws, you may see the results of your imaging and laboratory studies on MyChart before your provider has had a chance to review them.  We understand that in some cases there may be results that are confusing or concerning to you. Not all laboratory results come back in the same time frame and the provider may be waiting for multiple results in order to interpret others.  Please give [place] 48 hours in order for your provider to thoroughly review all the results before contacting the office for clarification of your results.

## 2022-06-09 ENCOUNTER — Encounter: Payer: Self-pay | Admitting: Gastroenterology

## 2022-06-09 NOTE — Progress Notes (Signed)
Rachael Walker    [id number]    [birth year]  Primary Care Physician:Walker, Rachael Filler, MD  Referring Physician: Darreld Walker, Rachael Walker,  Rachael Walker   Chief complaint:  UC, h/o H.pylori, GERD  HPI:  61 year old very pleasant female here for follow-up visit for ulcerative colitis, GERD, dysphagia and history of H. pylori gastritis She eradicate H. pylori s/p course of antibiotics.  Her bowel habits are slightly irregular and complaints of excessive bloating and gas after antibiotics. No longer has dysphagia or epigastric pain Overall she feels symptoms from ulcerative colitis are stable.  Denies any nausea, vomiting, abdominal pain, melena or bright red blood per rectum   She is tolerating mesalamine, has no issues   She is slowly regaining strength in her wrist and is noticing some improvement from the muscle loss in her hand     GI history: She completed 90-day course of budesonide to improve symptoms from an acute flare summer 2022.     Colonoscopy Oct 24, 2020 - Preparation of the colon was inadequate. - One 5 mm polyp in the ascending colon, removed with a cold snare. Resected and retrieved. - Patchy mild inflammation was found in the rectum, in the sigmoid colon and in the descending colon secondary to colitis. Biopsied. - The examined portion of the ileum was normal. - Non-bleeding external and internal hemorrhoids.   1. Surgical [P], colon, ascending, polyp (1) - SESSILE SERRATED POLYP WITHOUT CYTOLOGIC DYSPLASIA. 2. Surgical [P], right colon - UNREMARKABLE COLONIC MUCOSA. - NO ACTIVE INFLAMMATION OR GRANULOMAS. - NEGATIVE FOR DYSPLASIA. 3. Surgical [P], left colon - MODERATELY ACTIVE CHRONIC COLITIS CONSISTENT WITH INFLAMMATORY BOWEL DISEASE. - NO GRANULOMAS IDENTIFIED. - NEGATIVE FOR DYSPLASIA. 4. Surgical [P], colon, rectum - MODERATELY ACTIVE CHRONIC COLITIS CONSISTENT WITH INFLAMMATORY BOWEL  DISEASE. - NO GRANULOMAS IDENTIFIED. - NEGATIVE FOR DYSPLASIA.   Outpatient Encounter Medications as of 06/04/2022  Medication Sig   estradiol (ESTRACE) 2 MG tablet Take 1 tablet (2 mg total) by mouth daily.   OVER THE COUNTER MEDICATION Multivitamin 50 Plus tablet  Take 1 tablet every day by oral route.   pantoprazole (PROTONIX) 20 MG tablet Take 1 tablet (20 mg total) by mouth daily.   progesterone (PROMETRIUM) 100 MG capsule Take 1 capsule (100 mg total) by mouth daily.   PROLIA 60 MG/ML SOSY injection Inject 60 mg into the skin every 6 (six) months.   [DISCONTINUED] mesalamine (LIALDA) 1.2 g EC tablet TAKE 4 TABLETS BY MOUTH DAILY WITH BREAKFAST.   [DISCONTINUED] pantoprazole (PROTONIX) 40 MG tablet Take 1 tablet (40 mg total) by mouth daily.   mesalamine (LIALDA) 1.2 g EC tablet TAKE 4 TABLETS BY MOUTH DAILY WITH BREAKFAST.   [DISCONTINUED] ondansetron (ZOFRAN) 4 MG tablet Take 1 tablet (4 mg total) by mouth every 8 (eight) hours as needed for nausea or vomiting.   No facility-administered encounter medications on file as of 06/04/2022.    Allergies as of 06/04/2022 - Review Complete 06/04/2022  Allergen Reaction Noted   Nsaids  04/30/2021    Past Medical History:  Diagnosis Date   H. pylori infection    Left hand weakness    Muscle atrophy    Osteoporosis    UC (ulcerative colitis) [facility])     Past Surgical History:  Procedure Laterality Date   BUNIONECTOMY Right    COLONOSCOPY  2009   w/Rachael Walker   COLONOSCOPY  10/24/2020  Rachael Walker   COLONOSCOPY  2018   POLYPECTOMY     UPPER GASTROINTESTINAL ENDOSCOPY     WRIST FRACTURE SURGERY Left 01/03/2021    Family History  Problem Relation Age of Onset   Osteoporosis Mother    Colon polyps Mother    Cancer Father    Diabetes Father    Hyperlipidemia Father    Colon polyps Brother    Diabetes Brother    Cancer Maternal Grandmother        kidney   Heart disease Maternal Grandfather    Diabetes Paternal Grandmother     Colon cancer Neg Hx    Crohn's disease Neg Hx    Esophageal cancer Neg Hx    Rectal cancer Neg Hx    Stomach cancer Neg Hx     Social History   Socioeconomic History   Marital status: Married    Spouse name: Not on file   Number of children: 3   Years of education: Not on file   Highest education level: Bachelor's degree (e.g., BA, AB, BS)  Occupational History   Not on file  Tobacco Use   Smoking status: Never    Passive exposure: Never   Smokeless tobacco: Never  Vaping Use   Vaping Use: Never used  Substance and Sexual Activity   Alcohol use: Yes    Comment: occ. -2 times a month per pt   Drug use: No   Sexual activity: Not on file  Other Topics Concern   Not on file  Social History Narrative   Lives with husband   Right handed    Caffeine- 3 cups per day     Social Determinants of Health   Financial Resource Strain: Not on file  Food Insecurity: Not on file  Transportation Needs: Not on file  Physical Activity: Not on file  Stress: Not on file  Social Connections: Not on file  Intimate Partner Violence: Not on file      Review of systems: All other review of systems negative except as mentioned in the HPI.   Physical Exam: Vitals:   06/04/22 1041  BP: 100/62  Pulse: (Abnormal) 45   Body mass index is 21.77 kg/m. Gen:      No acute distress HEENT:  sclera anicteric Abd:      soft, non-tender; no palpable masses, no distension Ext:    No edema Neuro: alert and oriented x 3 Psych: normal mood and affect  Data Reviewed:  Reviewed labs, radiology imaging, old records and pertinent past GI work up   Assessment and Plan/Recommendations:  61 year old very pleasant female with ulcerative colitis during an acute flare in May 2022, improved with budesonide and is currently on maintenance dose of Lialda in clinical remission Will decrease Lialda to 2.4 g daily for maintenance, advised patient to go up on the dose if she starts noticing any  symptoms recurring on lower dose   H. pylori gastritis s/p eradication  GERD with erosive esophagitis: Use pantoprazole 20 mg daily and antireflux measures  Excessive gas and bloating: Use probiotic VSL3 1 capsule twice daily for 2 weeks and stop Advised patient to avoid using long-term probiotics as can cause excess growth of small intestine bacteria Increase intake of fruits and vegetables and water  Return in 6 months   The patient was provided an opportunity to ask questions and all were answered. The patient agreed with the plan and demonstrated an understanding of the instructions.  Damaris Hippo , MD  CC: Walker, Rachael Filler, MD

## 2022-06-14 NOTE — Progress Notes (Deleted)
[facility] [place] [facility] [address] [place], [place] [address] [phone]  Date:  06/17/2022   Name:  Rachael Walker   DOB:  [birth year]   MRN:  [id number]  PCP:  Darreld Mclean, MD    Chief Complaint: No chief complaint on file.   History of Present Illness:  Rachael Walker is a 61 y.o. very pleasant female patient who presents with the following:  Patient seen today for recheck and second dose of Shingrix She received her first Shingrix in August Our last visit was August 28 Generally in good health except for some MSK issues, ulcerative colitis Her gastroenterologist is Dr. Silverio Decamp Maintenance Lialda 4.8 g daily  Flu vaccine Can suggest COVID booster Mammogram, Pap  At her last visit she mentioned a couple of episodes of choking on food.  She underwent an upper GI in September-she was positive for H. pylori at that time H.pylori positive. Biopsies consistent with reflux esophagitis, negative for eosinophilic esophagitis Please inform patient the results. Thanks   Please send prescription for Talicia or Pylera X 10 days and PPI BID, if not covered by insurance send Rx for Bismuth 524 mg four times daily, Flagyl '250mg'$  1 tablet four times daily, Doxycycline '100mg'$  capsule Twice daily X 10 days along with PPI BID (Lansoprazole, Omeprazole or Nexium).   Will need to confirm eradication in 6 weeks by checking H.pylori stool Ag, off PPI for 2 weeks prior to the test.   Patient Active Problem List   Diagnosis Date Noted   Rupture of medial head of gastrocnemius, left, subsequent encounter 01/02/2020   Posterior pain of right hip 12/10/2016   BUNION, RIGHT FOOT 04/23/2010   ABNORMALITY OF GAIT 04/23/2010   UNEQUAL LEG LENGTH 03/31/2010   PLANTAR FASCIITIS, BILATERAL 03/13/2010   STRESS FRACTURE, FOOT 03/13/2010    Past Medical History:  Diagnosis Date   H. pylori infection    Left hand weakness    Muscle atrophy     Osteoporosis    UC (ulcerative colitis) ([place])     Past Surgical History:  Procedure Laterality Date   BUNIONECTOMY Right    COLONOSCOPY  2009   w/[name]   COLONOSCOPY  10/24/2020   nandigam   COLONOSCOPY  2018   POLYPECTOMY     UPPER GASTROINTESTINAL ENDOSCOPY     WRIST FRACTURE SURGERY Left 01/03/2021    Social History   Tobacco Use   Smoking status: Never    Passive exposure: Never   Smokeless tobacco: Never  Vaping Use   Vaping Use: Never used  Substance Use Topics   Alcohol use: Yes    Comment: occ. -2 times a month per pt   Drug use: No    Family History  Problem Relation Age of Onset   Osteoporosis Mother    Colon polyps Mother    Cancer Father    Diabetes Father    Hyperlipidemia Father    Colon polyps Brother    Diabetes Brother    Cancer Maternal Grandmother        kidney   Heart disease Maternal Grandfather    Diabetes Paternal Grandmother    Colon cancer Neg Hx    Crohn's disease Neg Hx    Esophageal cancer Neg Hx    Rectal cancer Neg Hx    Stomach cancer Neg Hx     Allergies  Allergen Reactions   Nsaids     Other reaction(s): Other (  See Comments) Ulcerative colitis    Medication list has been reviewed and updated.  Current Outpatient Medications on File Prior to Visit  Medication Sig Dispense Refill   estradiol (ESTRACE) 2 MG tablet Take 1 tablet (2 mg total) by mouth daily. 90 tablet 3   mesalamine (LIALDA) 1.2 g EC tablet TAKE 4 TABLETS BY MOUTH DAILY WITH BREAKFAST. 360 tablet 1   OVER THE COUNTER MEDICATION Multivitamin 50 Plus tablet  Take 1 tablet every day by oral route.     pantoprazole (PROTONIX) 20 MG tablet Take 1 tablet (20 mg total) by mouth daily. 90 tablet 1   progesterone (PROMETRIUM) 100 MG capsule Take 1 capsule (100 mg total) by mouth daily. 90 capsule 3   PROLIA 60 MG/ML SOSY injection Inject 60 mg into the skin every 6 (six) months.     No current facility-administered medications on file prior to visit.     Review of Systems:  As per HPI- otherwise negative.   Physical Examination: There were no vitals filed for this visit. There were no vitals filed for this visit. There is no height or weight on file to calculate BMI. Ideal Body Weight:    GEN: no acute distress. HEENT: Atraumatic, Normocephalic.  Ears and Nose: No external deformity. CV: RRR, No M/G/R. No JVD. No thrill. No extra heart sounds. PULM: CTA B, no wheezes, crackles, rhonchi. No retractions. No resp. distress. No accessory muscle use. ABD: S, NT, ND, +BS. No rebound. No HSM. EXTR: No c/c/e PSYCH: Normally interactive. Conversant.    Assessment and Plan: ***  Signed Lamar Blinks, MD

## 2022-06-17 ENCOUNTER — Ambulatory Visit (INDEPENDENT_AMBULATORY_CARE_PROVIDER_SITE_OTHER): Payer: Managed Care, Other (non HMO)

## 2022-06-17 ENCOUNTER — Ambulatory Visit: Payer: Managed Care, Other (non HMO) | Admitting: Family Medicine

## 2022-06-17 DIAGNOSIS — Z23 Encounter for immunization: Secondary | ICD-10-CM

## 2022-06-17 NOTE — Progress Notes (Signed)
Rachael Walker is a 62 y.o. female presents to the office today for 2nd Shingles  injections, per physician's orders. Original order: Dr.Copland 2nd Shingles (med),Left deltoid  (route) was administered  (location) today. Patient tolerated injection.    Jori Thrall M Falicia Lizotte

## 2022-07-31 ENCOUNTER — Telehealth: Payer: Self-pay | Admitting: Gastroenterology

## 2022-07-31 NOTE — Telephone Encounter (Signed)
Inbound call from patient, states her pharmacy is requesting a Prior Authorization for Pantoprazole. Please advise.

## 2022-08-05 ENCOUNTER — Telehealth: Payer: Self-pay | Admitting: Pharmacy Technician

## 2022-08-05 ENCOUNTER — Other Ambulatory Visit (HOSPITAL_COMMUNITY): Payer: Self-pay

## 2022-08-05 NOTE — Telephone Encounter (Signed)
Patient Advocate Encounter  Prior Authorization for PANTOPRAZOLE 20MG has been approved.    PA# [id number] Effective dates: 2.21.24 through 2.20.27   Received notification from [facility] that prior authorization for PANTOPRAZOLE 20MG is required.   PA submitted on 2.21.24 Key [id number] Status is pending

## 2022-08-14 ENCOUNTER — Other Ambulatory Visit: Payer: Self-pay | Admitting: Gastroenterology

## 2022-11-28 IMAGING — CT CT ABD-PELV W/ CM
2 of 5 series · 16 of 46 positions shown, 18 images · IV contrast (Omnipaque)
Comparison: None.

CLINICAL DATA: GI bleeding

EXAM:
CT ABDOMEN AND PELVIS WITH CONTRAST
TECHNIQUE: Multidetector CT imaging of the abdomen and pelvis was performed
using the standard protocol following bolus administration of
intravenous contrast.
CONTRAST:  100mL OMNIPAQUE IOHEXOL 300 MG/ML  SOLN

[Series 2: axial st · axial · 0.78mm/px · z∈[+528,+938]mm · 13 of 92 slices shown, 15 images]
[im 5/92  soft-tissue]
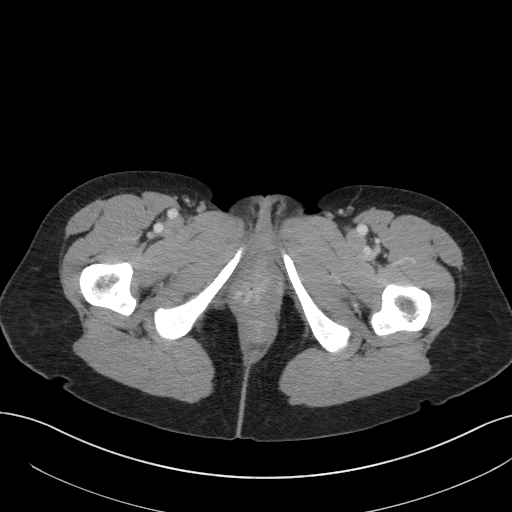
[im 5/92  bone]
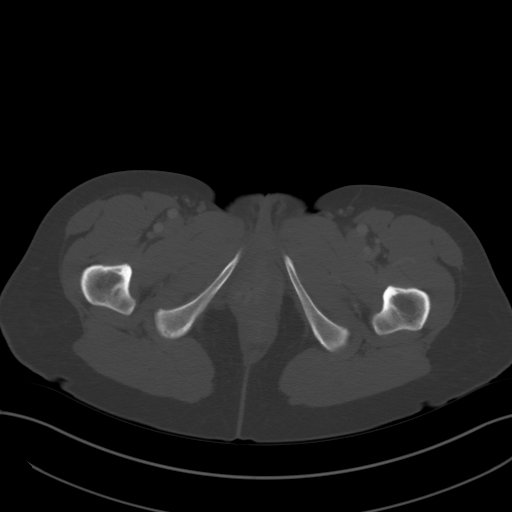
[im 14/92  soft-tissue]
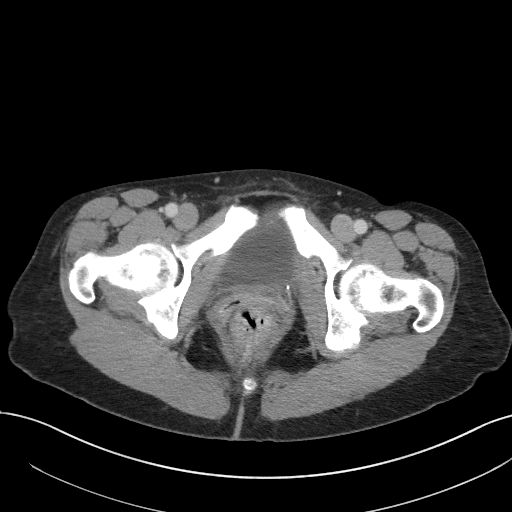
[im 19/92  soft-tissue]
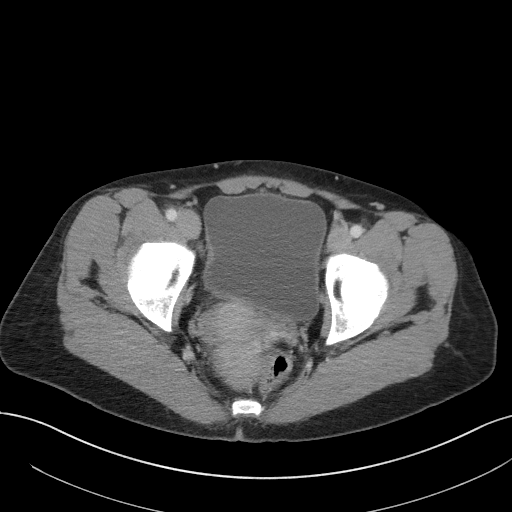
[im 28/92  soft-tissue]
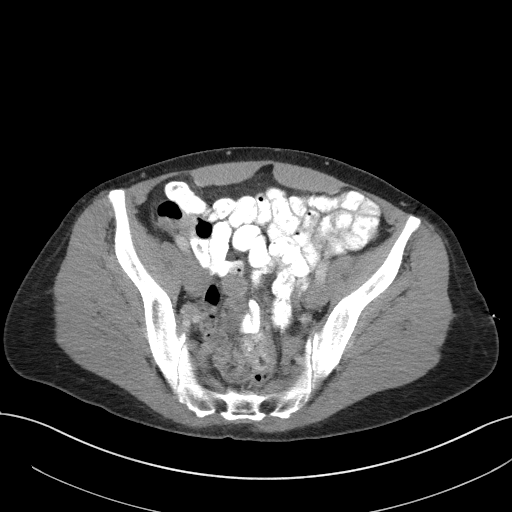
[im 32/92  soft-tissue]
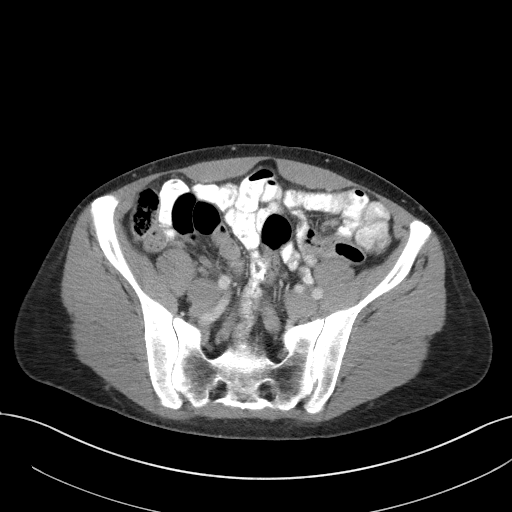
[im 41/92  soft-tissue]
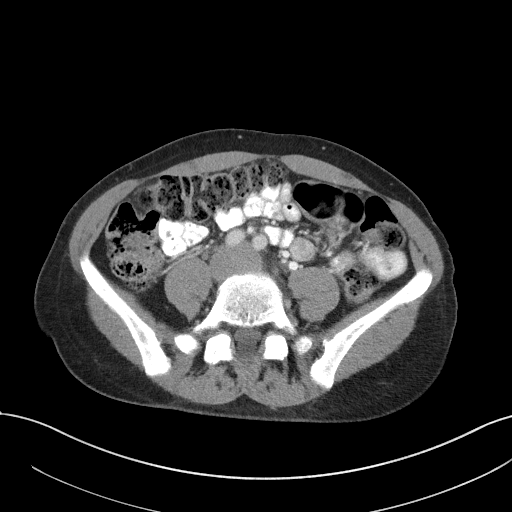
[im 46/92  soft-tissue]
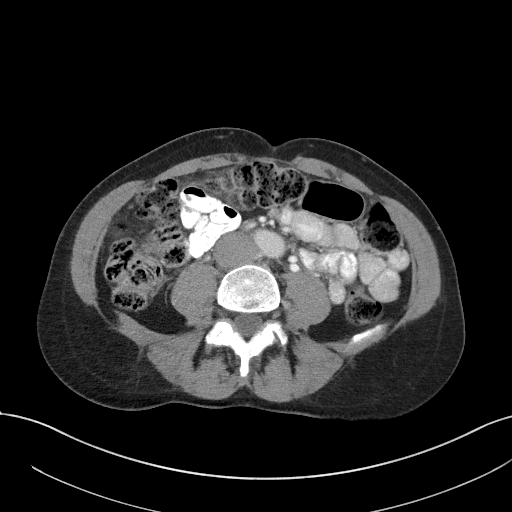
[im 51/92  soft-tissue]
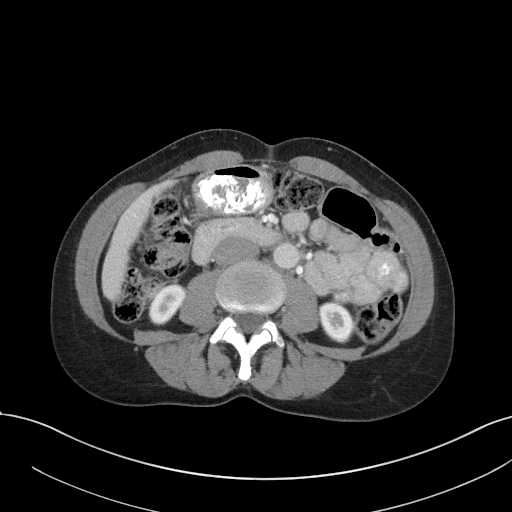
[im 60/92  soft-tissue]
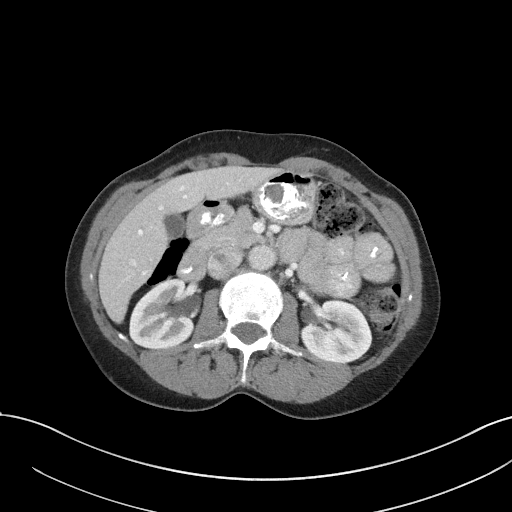
[im 60/92  bone]
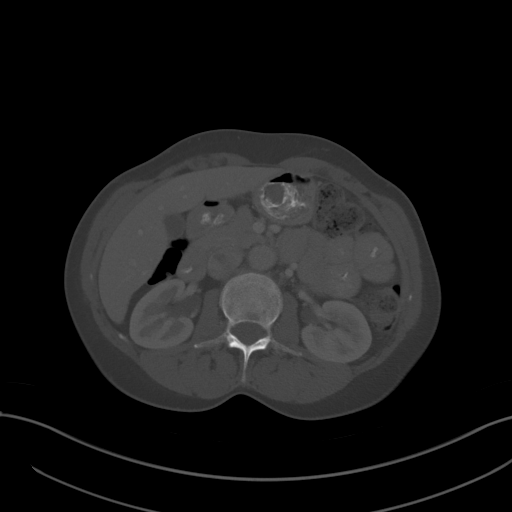
[im 64/92  soft-tissue]
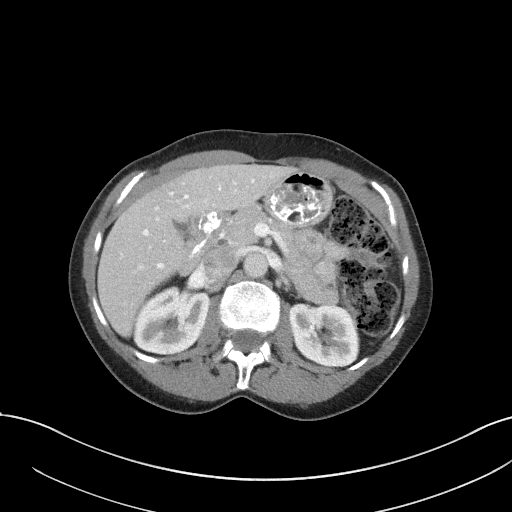
[im 73/92  soft-tissue]
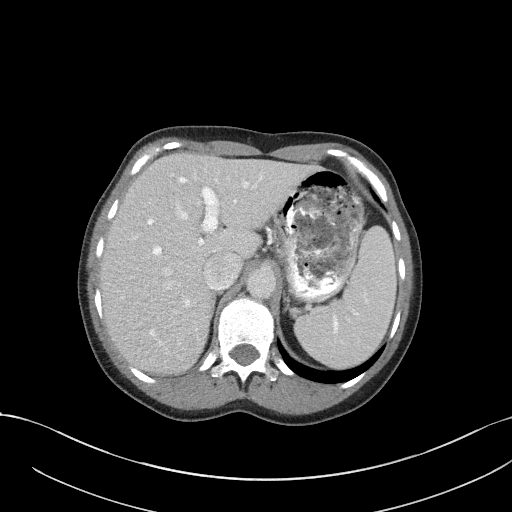
[im 78/92  soft-tissue]
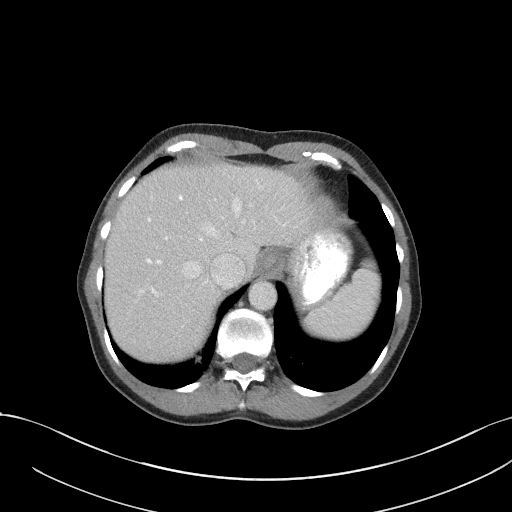
[im 87/92  soft-tissue]
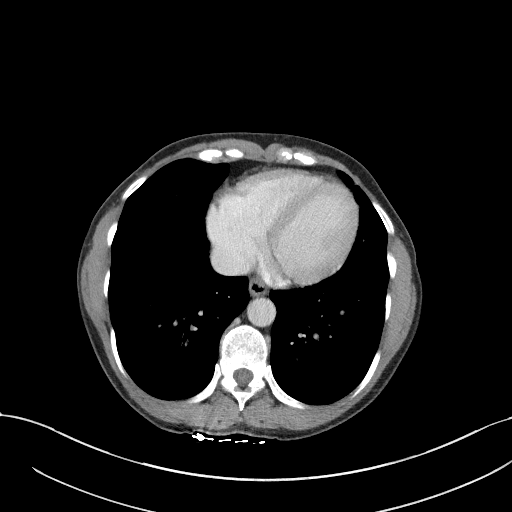

[Series 6: coronal st · coronal · 0.72mm/px · 3 of 86 slices shown]
[im 29/86  soft-tissue]
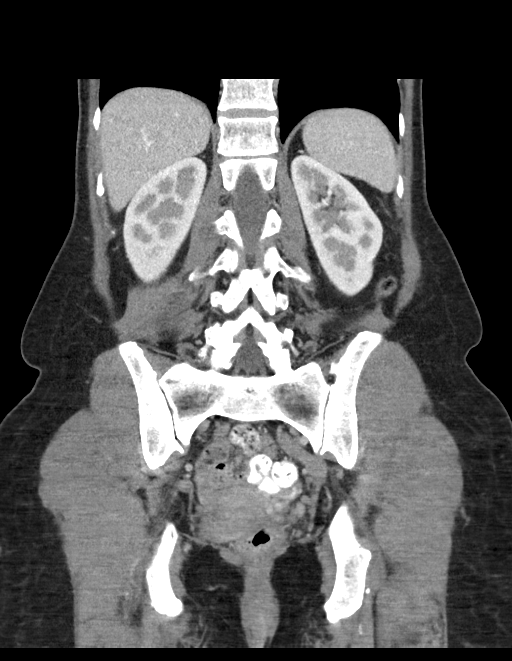
[im 38/86  soft-tissue]
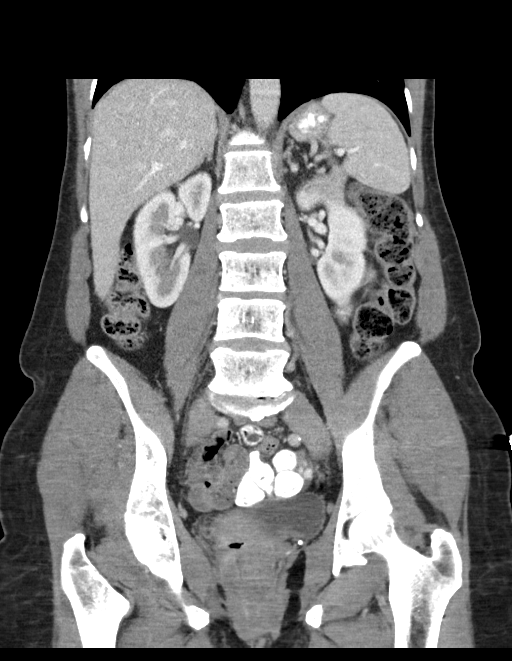
[im 48/86  soft-tissue]
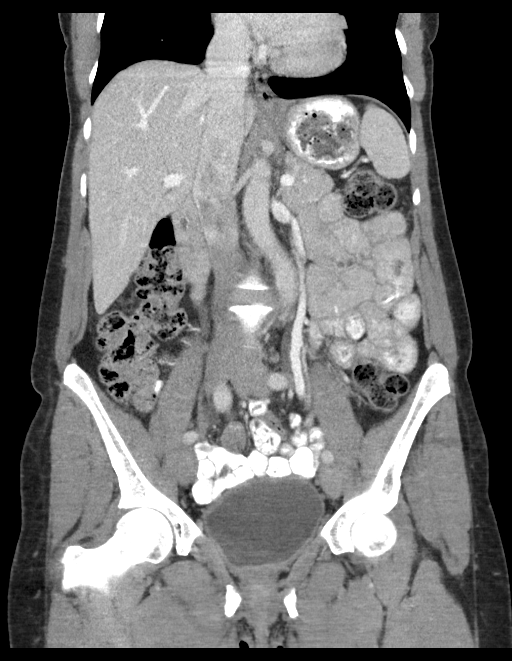

[16 of 46 positions shown; findings below may reference images not displayed]

FINDINGS: Lower chest: Lung bases demonstrate some very minimal atelectatic
changes in the medial right lung base.

Hepatobiliary: Mild fatty infiltration of the liver is noted. The
gallbladder is within normal limits.

Pancreas: Unremarkable. No pancreatic ductal dilatation or
surrounding inflammatory changes.

Spleen: Normal in size without focal abnormality.

Adrenals/Urinary Tract: Adrenal glands are unremarkable. Kidneys
demonstrate a normal enhancement pattern bilaterally. No renal
calculi or obstructive changes are seen. Normal excretion is noted
on delayed images. The bladder is well distended.

Stomach/Bowel: Mild retained fecal material is noted without
obstructive change. The appendix appears within normal limits. Small
bowel and stomach are unremarkable.

Vascular/Lymphatic: Mild tortuosity of the aorta is noted. No
aneurysmal dilatation is seen. No significant lymphadenopathy is
noted.

Reproductive: Uterus and bilateral adnexa are unremarkable.

Other: No abdominal wall hernia or abnormality. No abdominopelvic
ascites.

Musculoskeletal: Degenerative changes of lumbar spine are noted.
IMPRESSION: No findings to suggest acute GI hemorrhage.

Fatty liver.

Mild colonic constipation without obstructive change.

Mild atelectatic changes in the right base.

## 2022-12-12 ENCOUNTER — Other Ambulatory Visit: Payer: Self-pay | Admitting: Gastroenterology

## 2023-02-15 ENCOUNTER — Telehealth: Payer: Managed Care, Other (non HMO) | Admitting: Physician Assistant

## 2023-02-15 DIAGNOSIS — R3989 Other symptoms and signs involving the genitourinary system: Secondary | ICD-10-CM | POA: Diagnosis not present

## 2023-02-15 MED ORDER — CEPHALEXIN 500 MG PO CAPS
500.0000 mg | ORAL_CAPSULE | Freq: Two times a day (BID) | ORAL | 0 refills | Status: AC
Start: 2023-02-15 — End: ?

## 2023-02-15 NOTE — Progress Notes (Signed)
E-Visit for Urinary Problems  We are sorry that you are not feeling well.  Here is how we plan to help!  Based on what you shared with me it looks like you most likely have a simple urinary tract infection.  A UTI (Urinary Tract Infection) is a bacterial infection of the bladder.  Most cases of urinary tract infections are simple to treat but a key part of your care is to encourage you to drink plenty of fluids and watch your symptoms carefully.  I have prescribed Keflex 500 mg twice a day for 7 days.  Your symptoms should gradually improve. Call us if the burning in your urine worsens, you develop worsening fever, back pain or pelvic pain or if your symptoms do not resolve after completing the antibiotic.  Urinary tract infections can be prevented by drinking plenty of water to keep your body hydrated.  Also be sure when you wipe, wipe from front to back and don't hold it in!  If possible, empty your bladder every 4 hours.  HOME CARE Drink plenty of fluids Compete the full course of the antibiotics even if the symptoms resolve Remember, when you need to go.go. Holding in your urine can increase the likelihood of getting a UTI! GET HELP RIGHT AWAY IF: You cannot urinate You get a high fever Worsening back pain occurs You see blood in your urine You feel sick to your stomach or throw up You feel like you are going to pass out  MAKE SURE YOU  Understand these instructions. Will watch your condition. Will get help right away if you are not doing well or get worse.   Thank you for choosing an e-visit.  Your e-visit answers were reviewed by a board certified advanced clinical practitioner to complete your personal care plan. Depending upon the condition, your plan could have included both over the counter or prescription medications.  Please review your pharmacy choice. Make sure the pharmacy is open so you can pick up prescription now. If there is a problem, you may contact your  provider through MyChart messaging and have the prescription routed to another pharmacy.  Your safety is important to us. If you have drug allergies check your prescription carefully.   For the next 24 hours you can use MyChart to ask questions about today's visit, request a non-urgent call back, or ask for a work or school excuse. You will get an email in the next two days asking about your experience. I hope that your e-visit has been valuable and will speed your recovery.  I have spent 5 minutes in review of e-visit questionnaire, review and updating patient chart, medical decision making and response to patient.   Jennifer M Burnette, PA-C  

## 2023-02-20 IMAGING — DX DG WRIST COMPLETE 3+V*L*
1 series · 2 of 2 positions shown · non-contrast
Comparison: Earlier same day

CLINICAL DATA: Post reduction

EXAM:
LEFT WRIST - COMPLETE 3+ VIEW

[Series 1: wrist · 0.14mm/px · 2 of 2 slices shown]
[im 1/2]
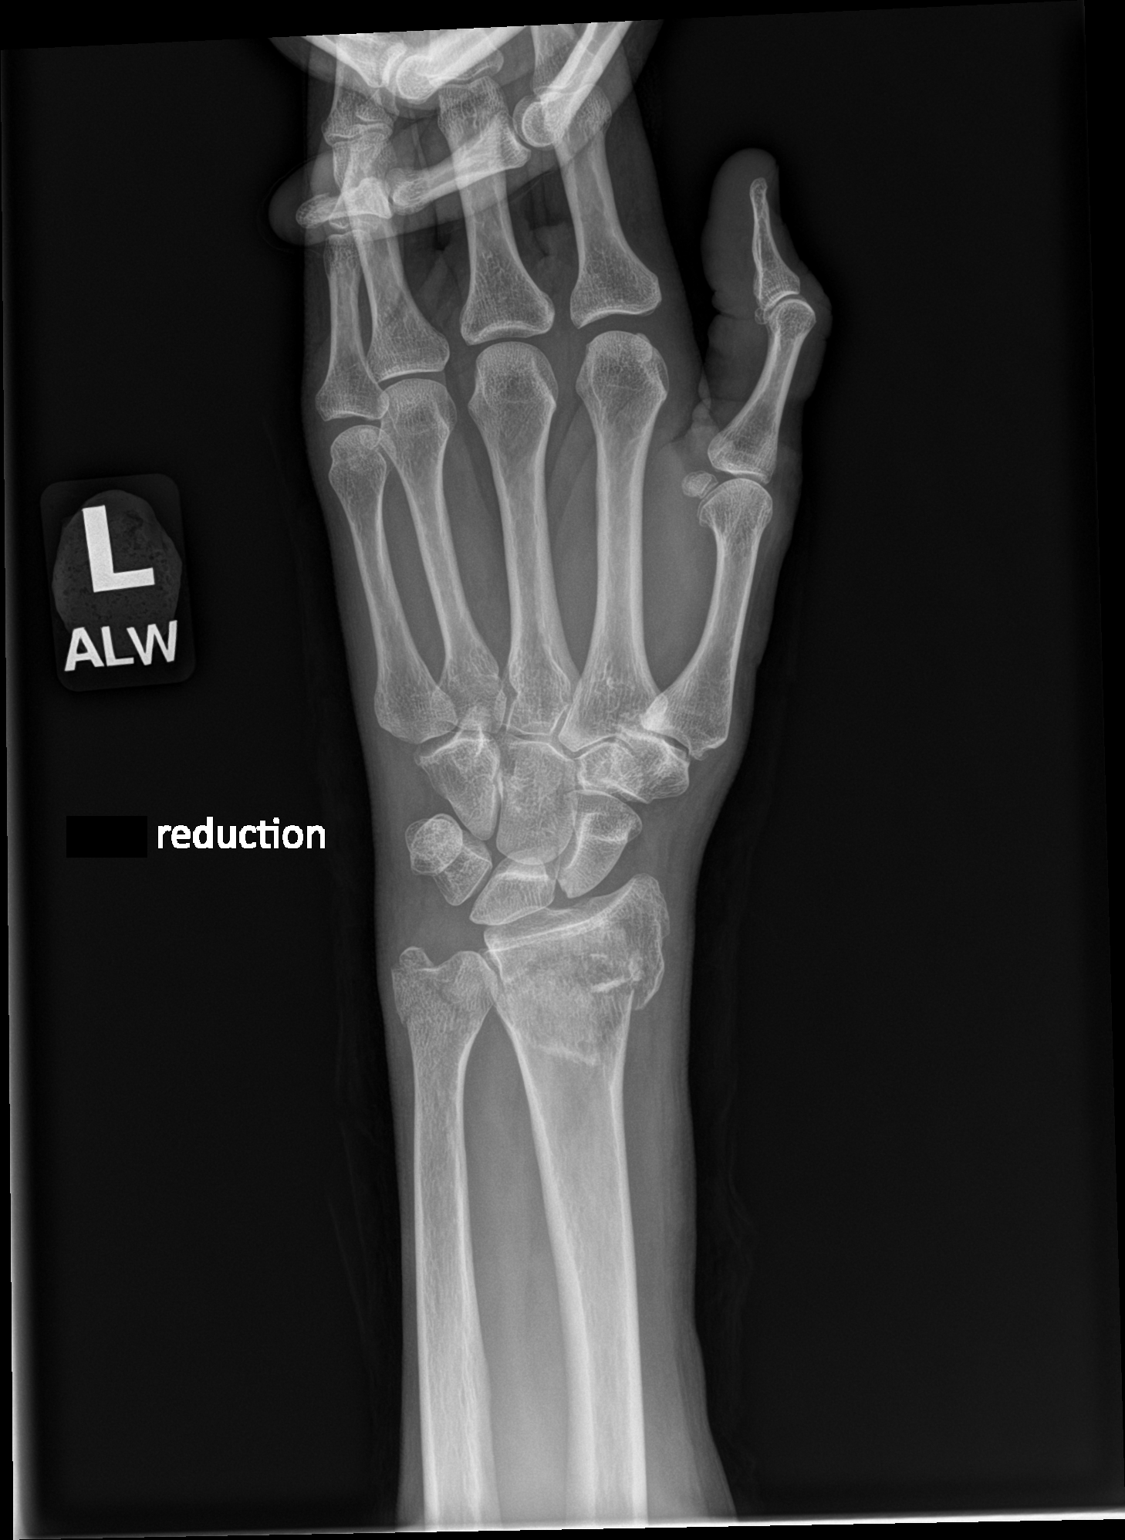
[im 2/2]
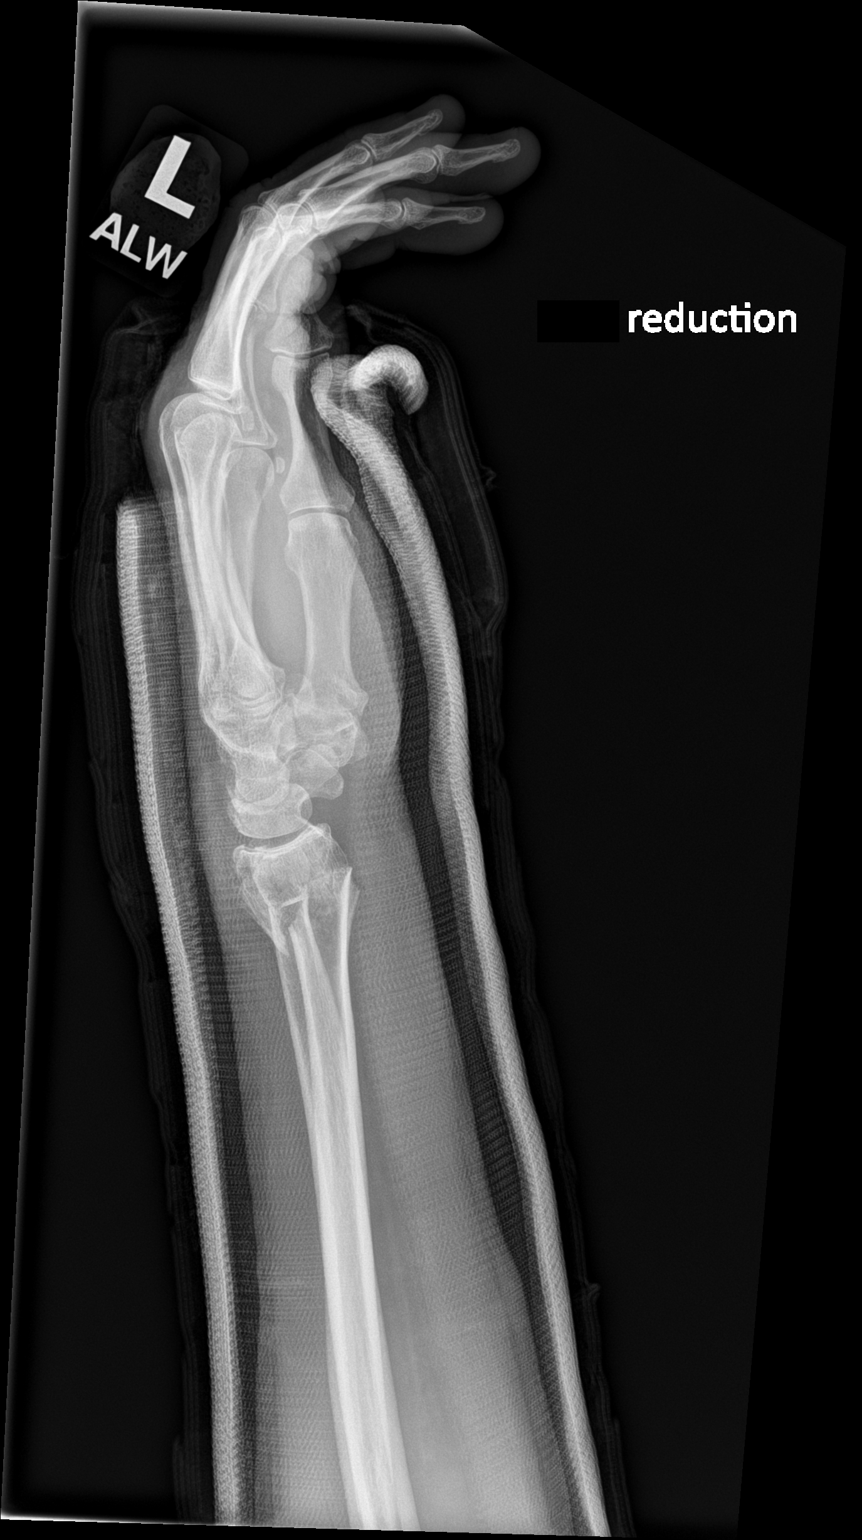

[2 of 2 positions shown; findings below may reference images not displayed]

FINDINGS: Displaced comminuted Colles fractures of the distal radius and ulna
have been well reduced with restoration of position and alignment
approaching anatomic. Neutral tilt of the distal radial articular
surface.
IMPRESSION: Good initial reduction of the comminuted and displaced Colles type
fractures of the distal radius and ulna.

## 2023-03-31 ENCOUNTER — Ambulatory Visit (INDEPENDENT_AMBULATORY_CARE_PROVIDER_SITE_OTHER): Payer: Managed Care, Other (non HMO) | Admitting: Sports Medicine

## 2023-03-31 ENCOUNTER — Encounter: Payer: Self-pay | Admitting: Sports Medicine

## 2023-03-31 VITALS — BP 110/82 | Ht 67.0 in | Wt 135.0 lb

## 2023-03-31 DIAGNOSIS — S76011A Strain of muscle, fascia and tendon of right hip, initial encounter: Secondary | ICD-10-CM

## 2023-03-31 NOTE — Progress Notes (Signed)
PCP: Copland, Gwenlyn Found, MD  Chief Complaint: Right hamstring pain Subjective:   HPI: Patient is a 62 y.o. female here for right hamstring pain. Patient states the pain Occurred approximately 6 weeks ago while [place].  Patient says she felt some pain in her right hamstring and was having a difficult time sitting and doing anything.  Patient put on a body helix and was icing it and states that over the next few weeks it started to improve.  Patient is an avid runner and runs approximately 6 miles every other day.  Patient has only been able to run for the past 2 weeks.  Prior to that patient has done a lot of rehab as well as Pilates and noticed some improvement but still notes some pain whenever she internally rotates her right leg    Past Medical History:  Diagnosis Date   H. pylori infection    Left hand weakness    Muscle atrophy    Osteoporosis    UC (ulcerative colitis) (HCC)     Current Outpatient Medications on File Prior to Visit  Medication Sig Dispense Refill   cephALEXin (KEFLEX) 500 MG capsule Take 1 capsule (500 mg total) by mouth 2 (two) times daily. 14 capsule 0   mesalamine (LIALDA) 1.2 g EC tablet TAKE 4 TABLETS BY MOUTH DAILY WITH BREAKFAST. 360 tablet 1   OVER THE COUNTER MEDICATION Multivitamin 50 Plus tablet  Take 1 tablet every day by oral route.     pantoprazole (PROTONIX) 20 MG tablet TAKE 1 TABLET EVERY DAY 90 tablet 1   PROLIA 60 MG/ML SOSY injection Inject 60 mg into the skin every 6 (six) months.     No current facility-administered medications on file prior to visit.    Past Surgical History:  Procedure Laterality Date   BUNIONECTOMY Right    COLONOSCOPY  2009   w/[name]   COLONOSCOPY  10/24/2020   nandigam   COLONOSCOPY  2018   POLYPECTOMY     UPPER GASTROINTESTINAL ENDOSCOPY     WRIST FRACTURE SURGERY Left 01/03/2021    Allergies  Allergen Reactions   Nsaids     Other reaction(s): Other (See Comments) Ulcerative  colitis    BP 110/82   Ht 5\' 7"  (1.702 m)   Wt 135 lb (61.2 kg)   LMP 02/24/2015 Comment: per pt doesn't last long, it's was for 1 day  BMI 21.14 kg/m       No data to display              No data to display              Objective:  Physical Exam:  Gen: NAD, comfortable in exam room  Right Hip: Inspection is negative for erythema, bruising, swelling.  Range of motion with flexion extension of the hip is full.  Flexion extension of the knee is full.  There is some mild tenderness to palpation over the hamstring insertion on the ischial tuberosity.  No tenderness over the hamstring muscle at its distal insertion.  Patient does note to have some cramping in the hamstring with flexion of the knee more so located in the biceps ffemoris area.  Strength is 5 out of 5 with extension, flexion and abduction.  Special test H test: Positive Hacky sac test: Positive FADIR/FABER: POS/NEG  Running gait was neutral with no limp   Assessment & Plan:  1. 1. Strain of flexor muscle of right hip, initial encounter -  Patient's physical exam is consistent with a likely injury to her sartorius due to a compensating mechanism because of her hamstring pain.  At this time, patient has had good improvement of her hamstring but will focus on certain exercises in order to fully rehab this as this has a high rate of recurrence. Patient was given [place] exercises  Will also give patient exercises to work on her sartorius as that may be contributing to her new onset hip pain. Patient also would benefit from a standard drop running shoe and is currently wearing Hoka's, will add a heel wedge in order to add extra thickness to her sole which should give her benefit and less complications/injuries while running.    Brenton Grills MD, PGY-4  Sports Medicine Fellow [facility]  I observed and examined the patient with the [facility] resident and agree with assessment and plan.  Note reviewed  and modified by me. Sterling Big, MD

## 2023-05-23 ENCOUNTER — Other Ambulatory Visit: Payer: Self-pay | Admitting: Gastroenterology

## 2023-07-08 ENCOUNTER — Other Ambulatory Visit: Payer: Self-pay | Admitting: Gastroenterology

## 2023-11-23 ENCOUNTER — Other Ambulatory Visit: Payer: Self-pay | Admitting: Gastroenterology

## 2024-02-08 ENCOUNTER — Other Ambulatory Visit: Payer: Self-pay | Admitting: Gastroenterology
